# Patient Record
Sex: Female | Born: 2000 | Race: White | Hispanic: No | Marital: Married | Smoking: Never smoker
Health system: Southern US, Community
[De-identification: ages and names within clinical notes are randomized; demographics above are authoritative.]

## PROBLEM LIST (undated history)

## (undated) DIAGNOSIS — K589 Irritable bowel syndrome without diarrhea: Secondary | ICD-10-CM

## (undated) DIAGNOSIS — M069 Rheumatoid arthritis, unspecified: Secondary | ICD-10-CM

## (undated) DIAGNOSIS — E611 Iron deficiency: Secondary | ICD-10-CM

## (undated) DIAGNOSIS — I7300 Raynaud's syndrome without gangrene: Secondary | ICD-10-CM

## (undated) DIAGNOSIS — K449 Diaphragmatic hernia without obstruction or gangrene: Secondary | ICD-10-CM

## (undated) HISTORY — DX: Raynaud's syndrome without gangrene: I73.00

## (undated) HISTORY — DX: Irritable bowel syndrome, unspecified: K58.9

## (undated) HISTORY — PX: TYMPANOSTOMY TUBE PLACEMENT: SHX32

## (undated) HISTORY — DX: Diaphragmatic hernia without obstruction or gangrene: K44.9

## (undated) HISTORY — DX: Iron deficiency: E61.1

---

## 2019-01-02 ENCOUNTER — Emergency Department (HOSPITAL_COMMUNITY)
Admission: EM | Admit: 2019-01-02 | Discharge: 2019-01-02 | Disposition: A | Discharge status: Home / Self Care | Payer: Medicaid Other | Attending: Emergency Medicine | Admitting: Emergency Medicine

## 2019-01-02 ENCOUNTER — Other Ambulatory Visit: Payer: Self-pay

## 2019-01-02 ENCOUNTER — Encounter (HOSPITAL_COMMUNITY): Payer: Self-pay

## 2019-01-02 ENCOUNTER — Emergency Department (HOSPITAL_COMMUNITY): Payer: Medicaid Other

## 2019-01-02 DIAGNOSIS — R0789 Other chest pain: Secondary | ICD-10-CM | POA: Diagnosis not present

## 2019-01-02 DIAGNOSIS — R030 Elevated blood-pressure reading, without diagnosis of hypertension: Secondary | ICD-10-CM | POA: Insufficient documentation

## 2019-01-02 HISTORY — DX: Rheumatoid arthritis, unspecified: M06.9

## 2019-01-02 LAB — URINALYSIS, ROUTINE W REFLEX MICROSCOPIC
Bilirubin Urine: NEGATIVE
Glucose, UA: NEGATIVE mg/dL
Hgb urine dipstick: NEGATIVE
Ketones, ur: NEGATIVE mg/dL
Nitrite: NEGATIVE
Protein, ur: NEGATIVE mg/dL
Specific Gravity, Urine: 1.008 (ref 1.005–1.030)
pH: 8 (ref 5.0–8.0)

## 2019-01-02 LAB — CBC WITH DIFFERENTIAL/PLATELET
Abs Immature Granulocytes: 0.02 10*3/uL (ref 0.00–0.07)
Basophils Absolute: 0 10*3/uL (ref 0.0–0.1)
Basophils Relative: 1 %
Eosinophils Absolute: 0.1 10*3/uL (ref 0.0–0.5)
Eosinophils Relative: 1 %
HCT: 41.7 % (ref 36.0–46.0)
Hemoglobin: 13.7 g/dL (ref 12.0–15.0)
Immature Granulocytes: 0 %
Lymphocytes Relative: 39 %
Lymphs Abs: 2.9 10*3/uL (ref 0.7–4.0)
MCH: 30.4 pg (ref 26.0–34.0)
MCHC: 32.9 g/dL (ref 30.0–36.0)
MCV: 92.7 fL (ref 80.0–100.0)
Monocytes Absolute: 0.8 10*3/uL (ref 0.1–1.0)
Monocytes Relative: 11 %
Neutro Abs: 3.5 10*3/uL (ref 1.7–7.7)
Neutrophils Relative %: 48 %
Platelets: 309 10*3/uL (ref 150–400)
RBC: 4.5 MIL/uL (ref 3.87–5.11)
RDW: 12.1 % (ref 11.5–15.5)
WBC: 7.3 10*3/uL (ref 4.0–10.5)
nRBC: 0 % (ref 0.0–0.2)

## 2019-01-02 LAB — BASIC METABOLIC PANEL
Anion gap: 10 (ref 5–15)
BUN: 7 mg/dL (ref 6–20)
CO2: 24 mmol/L (ref 22–32)
Calcium: 9.4 mg/dL (ref 8.9–10.3)
Chloride: 105 mmol/L (ref 98–111)
Creatinine, Ser: 0.67 mg/dL (ref 0.44–1.00)
GFR calc Af Amer: >60 mL/min (ref >60)
GFR calc non Af Amer: >60 mL/min (ref >60)
Glucose, Bld: 98 mg/dL (ref 70–99)
Potassium: 3.5 mmol/L (ref 3.5–5.1)
Sodium: 139 mmol/L (ref 135–145)

## 2019-01-02 LAB — TROPONIN I (HIGH SENSITIVITY): Troponin I (High Sensitivity): <2 ng/L (ref <18)

## 2019-01-02 LAB — I-STAT BETA HCG BLOOD, ED (MC, WL, AP ONLY): I-stat hCG, quantitative: <5 m[IU]/mL (ref <5)

## 2019-01-02 NOTE — ED Triage Notes (Signed)
Pt had BP checked at [facility] BP 132/100 because she was feeling jittery, sweaty, and fast HR.  Pt called her PCP and told  Her to come to ED.

## 2019-01-02 NOTE — ED Provider Notes (Signed)
[facility] EMERGENCY DEPARTMENT Provider Note   CSN: [id number] Arrival date & time: 01/02/19  1642     History   Chief Complaint Chief Complaint  Patient presents with  . Hypertension    HPI Tasha West is a 18 y.o. female with past medical history significant for rheumatoid arthritis presents emergency department with chief complaint of hypertension x1 day.  Patient states she drank coffee last night.  Afterwards she felt jittery, she attributed to the cup of coffee she had drank and went to bed.  She states when she woke up this morning she continued to have the jittery feeling and chest pain in the center of her chest.  She states the pain was a dull ache.  Pain does not radiate she rates the pain 3 out of 10 in severity.  Chest pain is not worse with exertion.  She went to [facility] and checked her blood pressure and it was found to be 132/100.  She reports that she was checking through feeling jittery, sweaty and like her heart was beating fast.  She called her primary care doctor who recommended ED evaluation if symptoms did not improve.  Patient states throughout the day she continued to feel the same way which prompted her to come to the emergency department.  She denies fever, chills, shortness of breath, abdominal pain, nausea, vomiting, lower extremity edema. Pt does not take birth control.  Cardiac risk factors include family history of mother with hypertension.  Patient does not smoke, is not diabetic, no history of diagnosed hypertension or CAD, no hyperlipidemia or PAD.   Past Medical History:  Diagnosis Date  . Rheumatoid arthritis (HCC)     There are no active problems to display for this patient.   History reviewed. No pertinent surgical history.   OB History   No obstetric history on file.      Home Medications    Prior to Admission medications   Not on File    Family History History reviewed. No pertinent family history.   Social History Social History   Tobacco Use  . Smoking status: Never Smoker  Substance Use Topics  . Alcohol use: Never    Frequency: Never  . Drug use: Never     Allergies   Patient has no known allergies.   Review of Systems Review of Systems  Constitutional: Negative for chills and fever.  HENT: Negative for congestion, ear discharge, ear pain, sinus pressure, sinus pain and sore throat.   Eyes: Negative for pain and redness.  Respiratory: Negative for cough, shortness of breath and wheezing.   Cardiovascular: Positive for chest pain. Negative for palpitations and leg swelling.  Gastrointestinal: Negative for abdominal pain, constipation, diarrhea, nausea and vomiting.  Genitourinary: Negative for dysuria and hematuria.  Musculoskeletal: Negative for back pain and neck pain.  Skin: Negative for wound.  Neurological: Negative for weakness, numbness and headaches.     Physical Exam Updated Vital Signs BP 131/83 (BP Location: Right Arm)   Pulse 83   Temp 97.9 F (36.6 C) (Oral)   Resp 16   LMP 12/23/2018   SpO2 100%   Physical Exam Vitals signs and nursing note reviewed.  Constitutional:      Appearance: She is well-developed. She is not ill-appearing or toxic-appearing.  HENT:     Head: Normocephalic and atraumatic.     Nose: Nose normal.  Eyes:     General: No scleral icterus.       Right  eye: No discharge.        Left eye: No discharge.     Conjunctiva/sclera: Conjunctivae normal.  Neck:     Musculoskeletal: Normal range of motion.     Vascular: No JVD.  Cardiovascular:     Rate and Rhythm: Normal rate and regular rhythm.     Pulses: Normal pulses.          Radial pulses are 2+ on the right side and 2+ on the left side.     Heart sounds: Normal heart sounds. No murmur.  Pulmonary:     Effort: Pulmonary effort is normal.     Breath sounds: Normal breath sounds.  Abdominal:     General: There is no distension.  Musculoskeletal: Normal range of  motion.     Right lower leg: No edema.     Left lower leg: No edema.  Skin:    General: Skin is warm and dry.  Neurological:     Mental Status: She is oriented to person, place, and time.     GCS: GCS eye subscore is 4. GCS verbal subscore is 5. GCS motor subscore is 6.     Comments: Fluent speech, no facial droop.  Psychiatric:        Behavior: Behavior normal.      ED Treatments / Results  Labs (all labs ordered are listed, but only abnormal results are displayed) Labs Reviewed  URINALYSIS, ROUTINE W REFLEX MICROSCOPIC - Abnormal; Notable for the following components:      Result Value   Color, Urine STRAW (*)    Leukocytes,Ua MODERATE (*)    Bacteria, UA RARE (*)    All other components within normal limits  CBC WITH DIFFERENTIAL/PLATELET  BASIC METABOLIC PANEL  I-STAT BETA HCG BLOOD, ED ([facility], [facility], [facility] ONLY)  TROPONIN I (HIGH SENSITIVITY)    EKG EKG Interpretation  Date/Time:  Wednesday January 02 2019 19:06:35 EDT Ventricular Rate:  95 PR Interval:    QRS Duration: 93 QT Interval:  363 QTC Calculation: 457 R Axis:   85 Text Interpretation: Sinus rhythm Right atrial enlargement RSR' in V1 or V2, probably normal variant No old tracing to compare Confirmed by Jacalyn LefevreHaviland, Julie [phone]([id number]) on 01/02/2019 7:12:10 PM   Radiology Dg Chest 2 View  Result Date: 01/02/2019 CLINICAL DATA:  Chest pain for 1 day EXAM: CHEST - 2 VIEW COMPARISON:  None. FINDINGS: The heart size and mediastinal contours are within normal limits. Both lungs are clear. The visualized skeletal structures are unremarkable. IMPRESSION: No active cardiopulmonary disease. Electronically Signed   By: Alcide CleverMark  Lukens M.D.   On: 01/02/2019 21:02    Procedures Procedures (including critical care time)  Medications Ordered in ED Medications - No data to display   Initial Impression / Assessment and Plan / ED Course  I have reviewed the triage vital signs and the nursing notes.  Pertinent labs & imaging  results that were available during my care of the patient were reviewed by me and considered in my medical decision making (see chart for details).  Patient presents to the emergency department with chest pain. Patient nontoxic appearing, in no apparent distress, vitals without significant abnormality.  She was noted to be tachycardic to 105 in triage.  On my initial exam she had a heart rate in the 80s.  Fairly benign physical exam. DDX: ACS, pulmonary embolism, dissection, pneumothorax, effusion, infiltrate, arrhythmia, anemia, electrolyte derangement, MSK. Evaluation initiated with labs, EKG, and CXR. Patient on cardiac monitor.   Work-up  in the ER unremarkable. Labs reviewed, no leukocytosis, anemia, or significant electrolyte abnormality. CXR without infiltrate, effusion, pneumothorax, or fracture/dislocation.  UA with moderate leukocytes, no WBC and rare bacteria, nitrite negative.  Discussed results with patient.  She denies dysuria, urinary frequency, gross hematuria or any signs of urinary tract infection will hold off on antibiotics at this time.  Low risk heart score of 1, EKG without obvious ischemia, troponin, doubt ACS. Patient is low risk wells, doubt pulmonary embolism. Pain is not a tearing sensation, symmetric pulses, no widening of mediastinum on CXR, doubt dissection. Cardiac monitor reviewed, no notable arrhythmias or tachycardia. Patient has appeared hemodynamically stable throughout ER visit and appears safe for discharge with close pcp follow up. I discussed results, treatment plan, need for PCP follow-up, and return precautions with the patient. Provided opportunity for questions, patient confirmed understanding and is in agreement with plan.   Portions of this note were generated with [employer]. Dictation errors may occur despite best attempts at proofreading.   Final Clinical Impressions(s) / ED Diagnoses   Final diagnoses:  Elevated blood pressure reading   Atypical chest pain    ED Discharge Orders    None       Flint Melter 01/02/19 2236    Isla Pence, MD 01/02/19 2251

## 2019-01-02 NOTE — ED Notes (Signed)
Patient verbalizes understanding of discharge instructions. Opportunity for questioning and answers were provided. Armband removed by staff, pt discharged from ED ambulatory.   

## 2019-01-02 NOTE — Discharge Instructions (Signed)
Read instructions below for reasons to return to the Emergency Department. It is recommended that your follow up with your Primary Care Doctor in regards to today's visit.   Tests performed today include: An EKG of your heart A chest x-ray Cardiac enzymes - a blood test for heart muscle damage Blood counts and electrolytes  Chest Pain (Nonspecific)  HOME CARE INSTRUCTIONS  -For the next few days, avoid physical activities that bring on chest pain. Continue physical activities as directed.   SEEK MEDICAL CARE IF:  You think you are having problems from the medicine you are taking. Read your medicine instructions carefully.  Your chest pain does not go away, even after treatment.  You develop a rash with blisters on your chest.   SEEK IMMEDIATE MEDICAL CARE IF:  You have increased chest pain or pain that spreads to your arm, neck, jaw, back, or belly (abdomen).  You develop shortness of breath, an increasing cough, or you are coughing up blood.  You have severe back or abdominal pain, feel sick to your stomach (nauseous) or throw up (vomit).  You develop severe weakness, fainting, or chills.  You have an oral temperature above 102 F (38.9 C), not controlled by medicine.   THIS IS AN EMERGENCY. Do not wait to see if the pain will go away. Get medical help at once. Call 911. Do not drive yourself to the hospital.

## 2019-07-24 ENCOUNTER — Other Ambulatory Visit: Payer: Self-pay | Admitting: Physician Assistant

## 2019-07-24 DIAGNOSIS — R1030 Lower abdominal pain, unspecified: Secondary | ICD-10-CM

## 2019-07-24 DIAGNOSIS — R634 Abnormal weight loss: Secondary | ICD-10-CM

## 2019-08-06 ENCOUNTER — Other Ambulatory Visit: Payer: Medicaid Other

## 2019-08-07 ENCOUNTER — Ambulatory Visit
Admission: RE | Admit: 2019-08-07 | Discharge: 2019-08-07 | Disposition: A | Discharge status: Home / Self Care | Payer: Medicaid Other | Source: Ambulatory Visit | Attending: Physician Assistant | Admitting: Physician Assistant

## 2019-08-07 ENCOUNTER — Other Ambulatory Visit: Payer: Self-pay

## 2019-08-07 DIAGNOSIS — R1030 Lower abdominal pain, unspecified: Secondary | ICD-10-CM

## 2019-08-07 DIAGNOSIS — R634 Abnormal weight loss: Secondary | ICD-10-CM

## 2019-08-07 MED ORDER — IOPAMIDOL (ISOVUE-300) INJECTION 61%
100.0000 mL | Freq: Once | INTRAVENOUS | Status: AC | PRN
  Administered 2019-08-07: 100 mL via INTRAVENOUS

## 2020-08-10 ENCOUNTER — Other Ambulatory Visit: Payer: Self-pay

## 2020-08-10 ENCOUNTER — Emergency Department (HOSPITAL_COMMUNITY): Payer: BC Managed Care – PPO

## 2020-08-10 ENCOUNTER — Encounter (HOSPITAL_COMMUNITY): Payer: Self-pay

## 2020-08-10 ENCOUNTER — Emergency Department (HOSPITAL_COMMUNITY)
Admission: EM | Admit: 2020-08-10 | Discharge: 2020-08-10 | Disposition: A | Discharge status: Home / Self Care | Payer: BC Managed Care – PPO | Attending: Emergency Medicine | Admitting: Emergency Medicine

## 2020-08-10 DIAGNOSIS — R55 Syncope and collapse: Secondary | ICD-10-CM | POA: Insufficient documentation

## 2020-08-10 DIAGNOSIS — N939 Abnormal uterine and vaginal bleeding, unspecified: Secondary | ICD-10-CM | POA: Insufficient documentation

## 2020-08-10 DIAGNOSIS — R5383 Other fatigue: Secondary | ICD-10-CM | POA: Diagnosis not present

## 2020-08-10 DIAGNOSIS — R000 Tachycardia, unspecified: Secondary | ICD-10-CM | POA: Insufficient documentation

## 2020-08-10 DIAGNOSIS — R0602 Shortness of breath: Secondary | ICD-10-CM | POA: Diagnosis not present

## 2020-08-10 LAB — URINALYSIS, ROUTINE W REFLEX MICROSCOPIC
Bilirubin Urine: NEGATIVE
Glucose, UA: NEGATIVE mg/dL
Ketones, ur: NEGATIVE mg/dL
Leukocytes,Ua: NEGATIVE
Nitrite: NEGATIVE
Protein, ur: NEGATIVE mg/dL
Specific Gravity, Urine: 1.003 — ABNORMAL LOW (ref 1.005–1.030)
pH: 7 (ref 5.0–8.0)

## 2020-08-10 LAB — CBC WITH DIFFERENTIAL/PLATELET
Abs Immature Granulocytes: 0.02 10*3/uL (ref 0.00–0.07)
Basophils Absolute: 0 10*3/uL (ref 0.0–0.1)
Basophils Relative: 1 %
Eosinophils Absolute: 0.1 10*3/uL (ref 0.0–0.5)
Eosinophils Relative: 1 %
HCT: 38 % (ref 36.0–46.0)
Hemoglobin: 12 g/dL (ref 12.0–15.0)
Immature Granulocytes: 0 %
Lymphocytes Relative: 35 %
Lymphs Abs: 2.2 10*3/uL (ref 0.7–4.0)
MCH: 28.1 pg (ref 26.0–34.0)
MCHC: 31.6 g/dL (ref 30.0–36.0)
MCV: 89 fL (ref 80.0–100.0)
Monocytes Absolute: 0.6 10*3/uL (ref 0.1–1.0)
Monocytes Relative: 9 %
Neutro Abs: 3.4 10*3/uL (ref 1.7–7.7)
Neutrophils Relative %: 54 %
Platelets: 316 10*3/uL (ref 150–400)
RBC: 4.27 MIL/uL (ref 3.87–5.11)
RDW: 13 % (ref 11.5–15.5)
WBC: 6.2 10*3/uL (ref 4.0–10.5)
nRBC: 0 % (ref 0.0–0.2)

## 2020-08-10 LAB — D-DIMER, QUANTITATIVE: D-Dimer, Quant: <0.27 ug/mL-FEU (ref 0.00–0.50)

## 2020-08-10 LAB — COMPREHENSIVE METABOLIC PANEL
ALT: 11 U/L (ref 0–44)
AST: 17 U/L (ref 15–41)
Albumin: 3.9 g/dL (ref 3.5–5.0)
Alkaline Phosphatase: 35 U/L — ABNORMAL LOW (ref 38–126)
Anion gap: 9 (ref 5–15)
BUN: 7 mg/dL (ref 6–20)
CO2: 23 mmol/L (ref 22–32)
Calcium: 9.1 mg/dL (ref 8.9–10.3)
Chloride: 105 mmol/L (ref 98–111)
Creatinine, Ser: 0.73 mg/dL (ref 0.44–1.00)
GFR, Estimated: >60 mL/min (ref >60)
Glucose, Bld: 94 mg/dL (ref 70–99)
Potassium: 3.6 mmol/L (ref 3.5–5.1)
Sodium: 137 mmol/L (ref 135–145)
Total Bilirubin: 0.5 mg/dL (ref 0.3–1.2)
Total Protein: 6.7 g/dL (ref 6.5–8.1)

## 2020-08-10 LAB — I-STAT BETA HCG BLOOD, ED (MC, WL, AP ONLY): I-stat hCG, quantitative: <5 m[IU]/mL (ref <5)

## 2020-08-10 LAB — TYPE AND SCREEN
ABO/RH(D): A POS
Antibody Screen: NEGATIVE

## 2020-08-10 MED ORDER — LACTATED RINGERS IV BOLUS
1000.0000 mL | Freq: Once | INTRAVENOUS | Status: AC
  Administered 2020-08-10: 1000 mL via INTRAVENOUS

## 2020-08-10 NOTE — Discharge Instructions (Addendum)
Call your primary care doctor or specialist as discussed in the next 2-3 days.   Return immediately back to the ER if:  Your symptoms worsen within the next 12-24 hours. You develop new symptoms such as new fevers, persistent vomiting, new pain, shortness of breath, or new weakness or numbness, or if you have any other concerns.  

## 2020-08-10 NOTE — ED Triage Notes (Signed)
Pt reports she is here today due to vaginal bleeding. Pt states she has h/o anemia and states she was told by her PCP to come to make sure she didn't need a blood transfusion.

## 2020-08-10 NOTE — ED Provider Notes (Signed)
Emergency Medicine Provider Triage Evaluation Note  Tasha West , a 20 y.o. female  was evaluated in triage.  Pt complains of presyncope x 1 week. Has had heavy menstrual cycle. Reports feeling presyncopal for hours the past week. Needing to rest, tired after walking. Some shortness of breath. No bloody stool, no hematemesis. Some nausea. Switched birthcontrol one Week ago. History of anemia.   Review of Systems  Positive: Above Negative: Above   Physical Exam  BP (!) 135/91 (BP Location: Right Arm)   Pulse (!) 108   Temp 98.6 F (37 C) (Oral)   Resp 14   LMP 08/10/2020   SpO2 100%  Gen:   Awake, no distress   Resp:  Normal effort  MSK:   Moves extremities without difficulty  Other:  S1, S2. Pale conjunctiva   Medical Decision Making  Medically screening exam initiated at 11:41 AM.  Appropriate orders placed.  Tasha West was informed that the remainder of the evaluation will be completed by another provider, this initial triage assessment does not replace that evaluation, and the importance of remaining in the ED until their evaluation is complete.     Theron Arista, PA-C 08/10/20 1144    Arby Barrette, MD 08/18/20 2125

## 2020-08-10 NOTE — ED Notes (Signed)
O2 dropped to 85% while ambulating. Pt stats she felt Sob, shaky, and lightheaded.

## 2020-08-10 NOTE — ED Provider Notes (Signed)
[facility] EMERGENCY DEPARTMENT Provider Note   CSN: [id number] Arrival date & time: 08/10/20  1131     History Chief Complaint  Patient presents with  . Vaginal Bleeding    Tasha West is a 20 y.o. female.  Patient is a 20 year old female with a history of rheumatoid arthritis presenting today with complaint of heavy vaginal bleeding, 4 days of lightheadedness with walking or standing, symptoms of near syncope, shortness of breath with exertion but no chest pain.  Patient did recently change birth control from the NuvaRing to a patch which does have estrogen in it.  She has a prior history of anemia and was concerned that that was the cause of her symptoms.  She denies any cough, congestion, fever.  No abdominal pain nausea or vomiting.  No dysuria frequency or urgency.  She denies any other vaginal discharge other than bleeding.  Last normal menses was last week and then developed heavy bleeding on Thursday.  It has been waxing and waning.  When she spoke with her doctor today they recommended she come here to rule out anemia.  No prior history of blood clots or family history.  No tobacco use and only occasional alcohol use.  The history is provided by the patient.  Vaginal Bleeding      Past Medical History:  Diagnosis Date  . Rheumatoid arthritis (HCC)     There are no problems to display for this patient.   History reviewed. No pertinent surgical history.   OB History   No obstetric history on file.     History reviewed. No pertinent family history.  Social History   Tobacco Use  . Smoking status: Never Smoker  Substance Use Topics  . Alcohol use: Never  . Drug use: Never    Home Medications Prior to Admission medications   Not on File    Allergies    Patient has no known allergies.  Review of Systems   Review of Systems  Genitourinary: Positive for vaginal bleeding.  All other systems reviewed and are  negative.   Physical Exam Updated Vital Signs BP 121/79 (BP Location: Left Arm)   Pulse 83   Temp 98.5 F (36.9 C) (Oral)   Resp 16   LMP 08/10/2020   SpO2 100%   Physical Exam Vitals and nursing note reviewed.  Constitutional:      General: She is not in acute distress.    Appearance: Normal appearance. She is well-developed.  HENT:     Head: Normocephalic and atraumatic.     Mouth/Throat:     Mouth: Mucous membranes are moist.  Eyes:     Pupils: Pupils are equal, round, and reactive to light.  Cardiovascular:     Rate and Rhythm: Regular rhythm. Tachycardia present.     Pulses: Normal pulses.     Heart sounds: Normal heart sounds. No murmur heard. No friction rub.  Pulmonary:     Effort: Pulmonary effort is normal.     Breath sounds: Normal breath sounds. No wheezing or rales.  Abdominal:     General: Bowel sounds are normal. There is no distension.     Palpations: Abdomen is soft.     Tenderness: There is no abdominal tenderness. There is no guarding or rebound.  Musculoskeletal:        General: No tenderness. Normal range of motion.     Cervical back: Normal range of motion and neck supple.     Comments: No edema  Skin:    General: Skin is warm and dry.     Findings: No rash.  Neurological:     Mental Status: She is alert and oriented to person, place, and time. Mental status is at baseline.     Cranial Nerves: No cranial nerve deficit.  Psychiatric:        Mood and Affect: Mood normal.        Behavior: Behavior normal.     ED Results / Procedures / Treatments   Labs (all labs ordered are listed, but only abnormal results are displayed) Labs Reviewed  COMPREHENSIVE METABOLIC PANEL - Abnormal; Notable for the following components:      Result Value   Alkaline Phosphatase 35 (*)    All other components within normal limits  URINALYSIS, ROUTINE W REFLEX MICROSCOPIC - Abnormal; Notable for the following components:   Specific Gravity, Urine 1.003 (*)     Hgb urine dipstick LARGE (*)    Bacteria, UA RARE (*)    All other components within normal limits  CBC WITH DIFFERENTIAL/PLATELET  I-STAT BETA HCG BLOOD, ED ([facility], [facility], [facility] ONLY)  TYPE AND SCREEN    EKG None  ED ECG REPORT   Date: 08/10/2020  Rate: 92  Rhythm: sinus arrhythmia  QRS Axis: normal  Intervals: normal  ST/T Wave abnormalities: normal  Conduction Disutrbances:none  Narrative Interpretation:   Old EKG Reviewed: none available  I have personally reviewed the EKG tracing and agree with the computerized printout as noted.   Radiology No results found.  Procedures Procedures   Medications Ordered in ED Medications - No data to display  ED Course  I have reviewed the triage vital signs and the nursing notes.  Pertinent labs & imaging results that were available during my care of the patient were reviewed by me and considered in my medical decision making (see chart for details).    MDM Rules/Calculators/A&P                          Patient presenting today with 4 days of feeling fatigue, shortness of breath with exertion, dizzy and near syncopal sometimes with standing and walking and heavier vaginal bleeding for the last 5 days.  Patient has had prior history of anemia before related to heavy menses but had been using the NuvaRing and periods had reduced.  She however went off of the NuvaRing and then started the patch several days ago but vaginal bleeding started before that time.  She denies any chest pain or infectious etiology at this time.  She does have increased risk for blood clot because of the birth control but no other risk factors.  Upon arrival patient was mildly tachycardic.  Hemoglobin today is normal at 12 and not the cause of her symptoms.  hCG is negative.  CMP is normal with normal creatinine sodium and potassium.  UA with blood but no other symptoms.  Patient is not displaying symptoms concerning for acute pelvic pathology.  She has no abdominal pain,  vaginal discharge.  Urine with some blood but is a contaminant and no other evidence of infection.  Will get chest x-ray, EKG and D-dimer for further evaluation. Final Clinical Impression(s) / ED Diagnoses Final diagnoses:  None    Rx / DC Orders ED Discharge Orders    None       Gwyneth Sprout, MD 08/11/20 1147

## 2020-08-10 NOTE — ED Provider Notes (Signed)
Is undetectable.  Given work-up today I doubt pulmonary embolism.  Patient continues at 90% on room air which is appropriate.  Will advise outpatient follow-up with her doctor within the week, advised immediate return for worsening symptoms fevers cough pain trouble breathing or any additional concerns.   Cheryll Cockayne, MD 08/10/20 [phone]

## 2020-08-30 ENCOUNTER — Emergency Department (HOSPITAL_COMMUNITY): Payer: BC Managed Care – PPO

## 2020-08-30 ENCOUNTER — Encounter (HOSPITAL_COMMUNITY): Payer: Self-pay | Admitting: Emergency Medicine

## 2020-08-30 ENCOUNTER — Other Ambulatory Visit: Payer: Self-pay

## 2020-08-30 ENCOUNTER — Emergency Department (HOSPITAL_COMMUNITY)
Admission: EM | Admit: 2020-08-30 | Discharge: 2020-08-31 | Disposition: A | Discharge status: Home / Self Care | Payer: BC Managed Care – PPO | Attending: Emergency Medicine | Admitting: Emergency Medicine

## 2020-08-30 DIAGNOSIS — R197 Diarrhea, unspecified: Secondary | ICD-10-CM | POA: Diagnosis not present

## 2020-08-30 DIAGNOSIS — R109 Unspecified abdominal pain: Secondary | ICD-10-CM

## 2020-08-30 DIAGNOSIS — R110 Nausea: Secondary | ICD-10-CM | POA: Diagnosis not present

## 2020-08-30 DIAGNOSIS — R1013 Epigastric pain: Secondary | ICD-10-CM | POA: Diagnosis not present

## 2020-08-30 DIAGNOSIS — R1011 Right upper quadrant pain: Secondary | ICD-10-CM | POA: Diagnosis not present

## 2020-08-30 LAB — URINALYSIS, ROUTINE W REFLEX MICROSCOPIC
Bilirubin Urine: NEGATIVE
Glucose, UA: NEGATIVE mg/dL
Ketones, ur: 20 mg/dL — AB
Leukocytes,Ua: NEGATIVE
Nitrite: NEGATIVE
Protein, ur: NEGATIVE mg/dL
Specific Gravity, Urine: 1.005 (ref 1.005–1.030)
pH: 5 (ref 5.0–8.0)

## 2020-08-30 LAB — COMPREHENSIVE METABOLIC PANEL
ALT: 23 U/L (ref 0–44)
AST: 30 U/L (ref 15–41)
Albumin: 3.6 g/dL (ref 3.5–5.0)
Alkaline Phosphatase: 33 U/L — ABNORMAL LOW (ref 38–126)
Anion gap: 9 (ref 5–15)
BUN: <5 mg/dL — ABNORMAL LOW (ref 6–20)
CO2: 22 mmol/L (ref 22–32)
Calcium: 8.7 mg/dL — ABNORMAL LOW (ref 8.9–10.3)
Chloride: 107 mmol/L (ref 98–111)
Creatinine, Ser: 0.64 mg/dL (ref 0.44–1.00)
GFR, Estimated: >60 mL/min (ref >60)
Glucose, Bld: 99 mg/dL (ref 70–99)
Potassium: 3.6 mmol/L (ref 3.5–5.1)
Sodium: 138 mmol/L (ref 135–145)
Total Bilirubin: 0.5 mg/dL (ref 0.3–1.2)
Total Protein: 6.4 g/dL — ABNORMAL LOW (ref 6.5–8.1)

## 2020-08-30 LAB — CBC
HCT: 39.5 % (ref 36.0–46.0)
Hemoglobin: 12.5 g/dL (ref 12.0–15.0)
MCH: 27.8 pg (ref 26.0–34.0)
MCHC: 31.6 g/dL (ref 30.0–36.0)
MCV: 87.8 fL (ref 80.0–100.0)
Platelets: 231 10*3/uL (ref 150–400)
RBC: 4.5 MIL/uL (ref 3.87–5.11)
RDW: 13.2 % (ref 11.5–15.5)
WBC: 6 10*3/uL (ref 4.0–10.5)
nRBC: 0 % (ref 0.0–0.2)

## 2020-08-30 LAB — I-STAT BETA HCG BLOOD, ED (MC, WL, AP ONLY): I-stat hCG, quantitative: <5 m[IU]/mL (ref <5)

## 2020-08-30 LAB — LIPASE, BLOOD: Lipase: 33 U/L (ref 11–51)

## 2020-08-30 MED ORDER — MORPHINE SULFATE (PF) 4 MG/ML IV SOLN
4.0000 mg | Freq: Once | INTRAVENOUS | Status: AC
  Administered 2020-08-30: 4 mg via INTRAVENOUS
  Filled 2020-08-30: qty 1

## 2020-08-30 MED ORDER — SODIUM CHLORIDE 0.9 % IV BOLUS
1000.0000 mL | Freq: Once | INTRAVENOUS | Status: AC
  Administered 2020-08-30: 1000 mL via INTRAVENOUS

## 2020-08-30 MED ORDER — ONDANSETRON HCL 4 MG/2ML IJ SOLN
4.0000 mg | Freq: Once | INTRAMUSCULAR | Status: AC
  Administered 2020-08-30: 4 mg via INTRAVENOUS
  Filled 2020-08-30: qty 2

## 2020-08-30 NOTE — ED Triage Notes (Signed)
Patient reports mid/RUQ abdominal pain with nausea and diarrhea onset this week .

## 2020-08-30 NOTE — ED Provider Notes (Signed)
[facility] EMERGENCY DEPARTMENT Provider Note   CSN: [id number] Arrival date & time: 08/30/20  2028     History Chief Complaint  Patient presents with   Abdominal Pain    Tasha West is a 20 y.o. female presents to the Emergency Department complaining of gradual, persistent, progressively worsening epigastric and RUQ abd pain onset this morning after eating breakfast.  Pt reports that throughout the day her symptoms have intensified and become more generalized.  Pt reports 3 days of some intermittent abd cramping, worse with food intake, but no severe pain. Pt denies fevers, chills, sick contacts, recent travel.  Pt reports watery diarrhea for the last 2 days without melena or hematochezia.  Pt denies hx of abdominal surgeries.  Eating makes the pain worse.  Lying still seems to make it better.  She denies all urinary and vaginal symptoms.   The history is provided by the patient and medical records. No language interpreter was used.      Past Medical History:  Diagnosis Date   Rheumatoid arthritis (HCC)     There are no problems to display for this patient.   No past surgical history on file.   OB History   No obstetric history on file.     No family history on file.  Social History   Tobacco Use   Smoking status: Never  Substance Use Topics   Alcohol use: Never   Drug use: Never    Home Medications Prior to Admission medications   Medication Sig Start Date End Date Taking? Authorizing Provider  bismuth subsalicylate (PEPTO BISMOL) 262 MG/15ML suspension Take 45 mLs by mouth every 6 (six) hours as needed for indigestion or diarrhea or loose stools.   Yes [provider]  cholecalciferol (VITAMIN D) 25 MCG (1000 UNIT) tablet Take 1,000 Units by mouth daily.   Yes [provider]  etanercept (ENBREL) 50 MG/ML injection Inject 50 mg into the skin every Tuesday. 07/24/19  Yes [provider]  ibuprofen (ADVIL) 200  MG tablet Take 200 mg by mouth every 6 (six) hours as needed for headache.   Yes [provider]  ondansetron (ZOFRAN) 4 MG tablet Take 1 tablet (4 mg total) by mouth every 8 (eight) hours as needed for nausea or vomiting. 08/31/20  Yes Jacklin Zwick, Dahlia Client, PA-C  pantoprazole (PROTONIX) 20 MG tablet Take 20 mg by mouth 2 (two) times daily. 05/31/20  Yes [provider]  PROAIR HFA 108 (90 Base) MCG/ACT inhaler Inhale 1 puff into the lungs every 6 (six) hours as needed for shortness of breath. 08/25/20  Yes [provider]  Probiotic Product (ALIGN) 4 MG CAPS Take 8 mg by mouth in the morning and at bedtime. 10/11/19  Yes [provider]  sucralfate (CARAFATE) 1 g tablet Take 1 tablet (1 g total) by mouth 4 (four) times daily -  with meals and at bedtime. 08/31/20  Yes Sharanda Shinault, Dahlia Client, PA-C  Turmeric (QC TUMERIC COMPLEX PO) Take 1 capsule by mouth daily.   Yes [provider]  vitamin B-12 (CYANOCOBALAMIN) 1000 MCG tablet Take 1,000 mcg by mouth daily.   Yes [provider]  Burr Medico 150-35 MCG/24HR transdermal patch Place 1 patch onto the skin once a week. 08/04/20  Yes [provider]    Allergies    Patient has no known allergies.  Review of Systems   Review of Systems  Constitutional:  Negative for appetite change, diaphoresis, fatigue, fever and unexpected weight change.  HENT:  Negative for mouth sores.   Eyes:  Negative for visual disturbance.  Respiratory:  Negative for cough, chest tightness, shortness of breath and wheezing.   Cardiovascular:  Negative for chest pain.  Gastrointestinal:  Positive for abdominal pain, diarrhea and nausea. Negative for constipation and vomiting.  Endocrine: Negative for polydipsia, polyphagia and polyuria.  Genitourinary:  Negative for dysuria, frequency, hematuria and urgency.  Musculoskeletal:  Negative for back pain and neck stiffness.  Skin:  Negative for rash.  Allergic/Immunologic:  Negative for immunocompromised state.  Neurological:  Negative for syncope, light-headedness and headaches.  Hematological:  Does not bruise/bleed easily.  Psychiatric/Behavioral:  Negative for sleep disturbance. The patient is not nervous/anxious.    Physical Exam Updated Vital Signs BP 112/74   Pulse 90   Temp 98.4 F (36.9 C) (Oral)   Resp 18   Ht 5\' 7"  (1.702 m)   Wt 59 kg   LMP 08/29/2020   SpO2 100%   BMI 20.37 kg/m   Physical Exam Vitals and nursing note reviewed.  Constitutional:      General: She is not in acute distress.    Appearance: She is not diaphoretic.  HENT:     Head: Normocephalic.  Eyes:     General: No scleral icterus.    Conjunctiva/sclera: Conjunctivae normal.  Cardiovascular:     Rate and Rhythm: Normal rate and regular rhythm.     Pulses: Normal pulses.          Radial pulses are 2+ on the right side and 2+ on the left side.  Pulmonary:     Effort: No tachypnea, accessory muscle usage, prolonged expiration, respiratory distress or retractions.     Breath sounds: No stridor.     Comments: Equal chest rise. No increased work of breathing. Abdominal:     General: There is no distension.     Palpations: Abdomen is soft.     Tenderness: There is abdominal tenderness in the right upper quadrant, epigastric area and periumbilical area. There is guarding. There is no right CVA tenderness, left CVA tenderness or rebound. Positive signs include Murphy's sign.     Hernia: No hernia is present.  Musculoskeletal:     Cervical back: Normal range of motion.     Comments: Moves all extremities equally and without difficulty.  Skin:    General: Skin is warm and dry.     Capillary Refill: Capillary refill takes less than 2 seconds.  Neurological:     Mental Status: She is alert.     GCS: GCS eye subscore is 4. GCS verbal subscore is 5. GCS motor subscore is 6.     Comments: Speech is clear and goal oriented.  Psychiatric:        Mood and Affect: Mood  normal.    ED Results / Procedures / Treatments   Labs (all labs ordered are listed, but only abnormal results are displayed) Labs Reviewed  COMPREHENSIVE METABOLIC PANEL - Abnormal; Notable for the following components:      Result Value   BUN <5 (*)    Calcium 8.7 (*)    Total Protein 6.4 (*)    Alkaline Phosphatase 33 (*)    All other components within normal limits  URINALYSIS, ROUTINE W REFLEX MICROSCOPIC - Abnormal; Notable for the following components:   Hgb urine dipstick LARGE (*)    Ketones, ur 20 (*)    Bacteria, UA FEW (*)    All other components within normal limits  LIPASE,  BLOOD  CBC  I-STAT BETA HCG BLOOD, ED ([facility], [facility], [facility] ONLY)      Radiology DG Abdomen Acute W/Chest  Result Date: 08/30/2020 CLINICAL DATA:  Right upper quadrant pain EXAM: DG ABDOMEN ACUTE WITH 1 VIEW CHEST COMPARISON:  None. FINDINGS: There is no evidence of dilated bowel loops or free intraperitoneal air. No radiopaque calculi or other significant radiographic abnormality is seen. Heart size and mediastinal contours are within normal limits. Both lungs are clear. IMPRESSION: Negative abdominal radiographs.  No acute cardiopulmonary disease. Electronically Signed   By: Charlett Nose M.D.   On: 08/30/2020 23:47   US Abdomen Limited RUQ (LIVER/GB)  Result Date: 08/31/2020 CLINICAL DATA:  Right upper quadrant pain with nausea and vomiting x3 days. EXAM: ULTRASOUND ABDOMEN LIMITED RIGHT UPPER QUADRANT COMPARISON:  None. FINDINGS: Gallbladder: No gallstones or wall thickening visualized (1.9 mm). No sonographic Murphy sign noted by sonographer. Common bile duct: Diameter: 3.1 mm Liver: No focal lesion identified. Within normal limits in parenchymal echogenicity. Portal vein is patent on color Doppler imaging with normal direction of blood flow towards the liver. Other: None. IMPRESSION: Right upper quadrant ultrasound. Electronically Signed   By: Aram Candela M.D.   On: 08/31/2020 00:15     Procedures Procedures   Medications Ordered in ED Medications  sodium chloride 0.9 % bolus 1,000 mL (0 mLs Intravenous Stopped 08/31/20 0039)  morphine 4 MG/ML injection 4 mg (4 mg Intravenous Given 08/30/20 2324)  ondansetron (ZOFRAN) injection 4 mg (4 mg Intravenous Given 08/30/20 2324)    ED Course  I have reviewed the triage vital signs and the nursing notes.  Pertinent labs & imaging results that were available during my care of the patient were reviewed by me and considered in my medical decision making (see chart for details).    MDM Rules/Calculators/A&P                          Pt presents with abd pain, nausea and diarrhea.  Labs are reassuring.  Abd most tender in the epigastrium, periumbilical region and RUQ.  Given association with food intake, will obtain RUQ [place].  Lipase WNL - less likely to be pancreatitis.   1:08 AM Plain films are without abnormal bowel gas pattern.  And evidence of bowel obstruction.  Right upper quadrant ultrasound without evidence of gallstones or cholecystitis.  Patient with improved pain after pain control and nausea medications.  She has been able to eat and drink without difficulty.  No persistent diarrhea or vomiting here in the emergency department.  Abdomen remains sore but without rebound or guarding at this time.  Will discharge home with Carafate, Zofran and instructions to hydrate.  Patient is to follow with primary care.  Discussed reasons to return to the emergency department.  Patient states understanding and is in agreement with the plan.   Final Clinical Impression(s) / ED Diagnoses Final diagnoses:  Epigastric abdominal pain    Rx / DC Orders ED Discharge Orders          Ordered    sucralfate (CARAFATE) 1 g tablet  3 times daily with meals & bedtime        08/31/20 0101    ondansetron (ZOFRAN) 4 MG tablet  Every 8 hours PRN        08/31/20 0101             Hendryx Ricke, Boyd Kerbs 08/31/20 0108    Zadie Rhine, MD 08/31/20  0121  

## 2020-08-31 MED ORDER — SUCRALFATE 1 G PO TABS: 1.0000 g | ORAL_TABLET | Freq: Three times a day (TID) | ORAL | 0 refills | Status: DC

## 2020-08-31 MED ORDER — ONDANSETRON HCL 4 MG PO TABS: 4.0000 mg | ORAL_TABLET | Freq: Three times a day (TID) | ORAL | 0 refills | Status: DC | PRN

## 2020-08-31 NOTE — Discharge Instructions (Addendum)
1. Medications: Carafate, zofran for nausea; Continue protonix as directed; usual home medications 2. Treatment: rest, drink plenty of fluids, advance diet slowly 3. Follow Up: Please followup with your primary doctor in 1-2 days for discussion of your diagnoses and further evaluation after today's visit; if you do not have a primary care doctor use the resource guide provided to find one; Please return to the ER for persistent vomiting, worsening pain, high fevers or other concerns

## 2021-01-21 ENCOUNTER — Other Ambulatory Visit: Payer: Self-pay

## 2021-01-21 ENCOUNTER — Ambulatory Visit (HOSPITAL_COMMUNITY)
Admission: EM | Admit: 2021-01-21 | Discharge: 2021-01-21 | Discharge status: Against Medical Advice | Payer: BC Managed Care – PPO

## 2021-04-17 ENCOUNTER — Other Ambulatory Visit: Payer: Self-pay

## 2021-04-17 ENCOUNTER — Encounter (HOSPITAL_COMMUNITY): Payer: Self-pay

## 2021-04-17 ENCOUNTER — Ambulatory Visit (HOSPITAL_COMMUNITY)
Admission: EM | Admit: 2021-04-17 | Discharge: 2021-04-17 | Disposition: A | Discharge status: Home / Self Care | Payer: Medicaid Other | Attending: Urgent Care | Admitting: Urgent Care

## 2021-04-17 DIAGNOSIS — H6591 Unspecified nonsuppurative otitis media, right ear: Secondary | ICD-10-CM | POA: Diagnosis not present

## 2021-04-17 MED ORDER — FLUTICASONE PROPIONATE 50 MCG/ACT NA SUSP: 1.0000 | Freq: Two times a day (BID) | NASAL | 0 refills | Status: DC

## 2021-04-17 MED ORDER — AMOXICILLIN 500 MG PO CAPS: 1000.0000 mg | ORAL_CAPSULE | Freq: Two times a day (BID) | ORAL | 0 refills | Status: AC

## 2021-04-17 NOTE — ED Provider Notes (Signed)
[facility]    CSN: [id number] Arrival date & time: 04/17/21  1007      History   Chief Complaint Chief Complaint  Patient presents with   Ear Pain    HPI Ananiah Bourdon Rosenman is a 21 y.o. female.   Pleasant 20 year old female presents today with concerns of right ear pain x2 days.  She states for the past week she has been having upper respiratory symptoms including a slightly productive cough, postnasal drainage, sinus congestion.  She has been taking over-the-counter medication and saline nasal sprays which is helped somewhat, but reports over the past 2 days the ear on the right has been excruciatingly painful.  She denies any fever.  She denies any nuchal rigidity headache or neck pain.  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  She denies any chest pain, shortness of breath, palpitations.    Past Medical History:  Diagnosis Date   Rheumatoid arthritis (HCC)     There are no problems to display for this patient.   History reviewed. No pertinent surgical history.  OB History   No obstetric history on file.      Home Medications    Prior to Admission medications   Medication Sig Start Date End Date Taking? Authorizing Provider  amoxicillin (AMOXIL) 500 MG capsule Take 2 capsules (1,000 mg total) by mouth 2 (two) times daily for 10 days. 04/17/21 04/27/21 Yes Heavenlee Maiorana L, PA  fluticasone (FLONASE) 50 MCG/ACT nasal spray Place 1 spray into both nostrils in the morning and at bedtime. 04/17/21  Yes Antoinette Borgwardt L, PA  bismuth subsalicylate (PEPTO BISMOL) 262 MG/15ML suspension Take 45 mLs by mouth every 6 (six) hours as needed for indigestion or diarrhea or loose stools.    [provider]  cholecalciferol (VITAMIN D) 25 MCG (1000 UNIT) tablet Take 1,000 Units by mouth daily.    [provider]  etanercept (ENBREL) 50 MG/ML  injection Inject 50 mg into the skin every Tuesday. 07/24/19   [provider]  ibuprofen (ADVIL) 200 MG tablet Take 200 mg by mouth every 6 (six) hours as needed for headache.    [provider]  ondansetron (ZOFRAN) 4 MG tablet Take 1 tablet (4 mg total) by mouth every 8 (eight) hours as needed for nausea or vomiting. 08/31/20   Muthersbaugh, Dahlia Client, PA-C  pantoprazole (PROTONIX) 20 MG tablet Take 20 mg by mouth 2 (two) times daily. 05/31/20   [provider]  PROAIR HFA 108 (90 Base) MCG/ACT inhaler Inhale 1 puff into the lungs every 6 (six) hours as needed for shortness of breath. 08/25/20   [provider]  Probiotic Product (ALIGN) 4 MG CAPS Take 8 mg by mouth in the morning and at bedtime. 10/11/19   [provider]  sucralfate (CARAFATE) 1 g tablet Take 1 tablet (1 g total) by mouth 4 (four) times daily -  with meals and at bedtime. 08/31/20   Muthersbaugh, Dahlia Client, PA-C  Turmeric (QC TUMERIC COMPLEX PO) Take 1 capsule by mouth daily.    [provider]  vitamin B-12 (CYANOCOBALAMIN) 1000 MCG tablet Take 1,000 mcg by mouth daily.    [provider]  Burr Medico 150-35 MCG/24HR transdermal patch Place 1 patch onto the skin once a week. 08/04/20   [provider]    Family History History reviewed. No pertinent family history.  Social History Social History  Tobacco Use   Smoking status: Never  Substance Use Topics   Alcohol use: Never   Drug use: Never     Allergies   Patient has no known allergies.   Review of Systems Review of Systems  HENT:  Positive for congestion, ear pain, postnasal drip and sinus pressure.   Respiratory:  Positive for cough.   All other systems reviewed and are negative.   Physical Exam Triage Vital Signs ED Triage Vitals [04/17/21 1024]  Enc Vitals Group     BP 129/87     Pulse Rate 92     Resp 16     Temp 98 F (36.7 C)     Temp Source Oral     SpO2 98 %     Weight      Height       Head Circumference      Peak Flow      Pain Score      Pain Loc      Pain Edu?      Excl. in GC?    No data found.  Updated Vital Signs BP 129/87 (BP Location: Left Arm)    Pulse 92    Temp 98 F (36.7 C) (Oral)    Resp 16    LMP 04/17/2021    SpO2 98%   Visual Acuity Right Eye Distance:   Left Eye Distance:   Bilateral Distance:    Right Eye Near:   Left Eye Near:    Bilateral Near:     Physical Exam Vitals and nursing note reviewed. Exam conducted with a chaperone present.  Constitutional:      General: She is not in acute distress.    Appearance: Normal appearance. She is well-developed and normal weight. She is not ill-appearing, toxic-appearing or diaphoretic.  HENT:     Head: Normocephalic and atraumatic.     Right Ear: Ear canal and external ear normal. Decreased hearing noted. A middle ear effusion is present. There is no impacted cerumen. Tympanic membrane is erythematous and bulging. Tympanic membrane has decreased mobility.     Left Ear: Ear canal and external ear normal. A middle ear effusion is present. There is no impacted cerumen.     Nose: Nose normal. No congestion or rhinorrhea.     Mouth/Throat:     Mouth: Mucous membranes are moist.     Pharynx: Oropharynx is clear. No oropharyngeal exudate or posterior oropharyngeal erythema.  Eyes:     General: No scleral icterus.       Right eye: No discharge.        Left eye: No discharge.     Extraocular Movements: Extraocular movements intact.     Conjunctiva/sclera: Conjunctivae normal.     Pupils: Pupils are equal, round, and reactive to light.  Cardiovascular:     Rate and Rhythm: Normal rate and regular rhythm.     Heart sounds: No murmur heard. Pulmonary:     Effort: Pulmonary effort is normal. No respiratory distress.     Breath sounds: Normal breath sounds.  Abdominal:     Palpations: Abdomen is soft.     Tenderness: There is no abdominal tenderness.  Musculoskeletal:        General: No  swelling.     Cervical back: Normal range of motion and neck supple. No rigidity or tenderness.  Lymphadenopathy:     Cervical: No cervical adenopathy.  Skin:    General: Skin is warm and dry.  Capillary Refill: Capillary refill takes less than 2 seconds.  Neurological:     Mental Status: She is alert.  Psychiatric:        Mood and Affect: Mood normal.     UC Treatments / Results  Labs (all labs ordered are listed, but only abnormal results are displayed) Labs Reviewed - No data to display  EKG   Radiology No results found.  Procedures Procedures (including critical care time)  Medications Ordered in UC Medications - No data to display  Initial Impression / Assessment and Plan / UC Course  I have reviewed the triage vital signs and the nursing notes.  Pertinent labs & imaging results that were available during my care of the patient were reviewed by me and considered in my medical decision making (see chart for details).     OM with effusion, R - amoxicillin as prescribed. Add flonase to help with ET. Continue saline lavages in sinus passages. F/U with PCP in 2 weeks to ensure middle ear effusion has been completely resolved.  Final Clinical Impressions(s) / UC Diagnoses   Final diagnoses:  Otitis media with effusion, right     Discharge Instructions      You have a right ear infection, with a lot of fluid behind the eardrum. Please continue your saline nasal washes, we will also start Flonase nasal spray. Take all of your antibiotic as prescribed, until completed.  Consider taking a probiotic or yogurt to prevent any adverse reactions from the antibiotic. Follow-up with your primary care in 2 weeks to ensure full resolution of the ear fluid.     ED Prescriptions     Medication Sig Dispense Auth. Provider   fluticasone (FLONASE) 50 MCG/ACT nasal spray Place 1 spray into both nostrils in the morning and at bedtime. 16 g Obi Scrima L, PA    amoxicillin (AMOXIL) 500 MG capsule Take 2 capsules (1,000 mg total) by mouth 2 (two) times daily for 10 days. 40 capsule Yuliana Vandrunen L, PA      PDMP not reviewed this encounter.   Maretta Bees, [place] 04/17/21 1046

## 2021-04-17 NOTE — Discharge Instructions (Signed)
You have a right ear infection, with a lot of fluid behind the eardrum. Please continue your saline nasal washes, we will also start Flonase nasal spray. Take all of your antibiotic as prescribed, until completed.  Consider taking a probiotic or yogurt to prevent any adverse reactions from the antibiotic. Follow-up with your primary care in 2 weeks to ensure full resolution of the ear fluid.

## 2021-04-17 NOTE — ED Triage Notes (Signed)
Pt presents to the office for ear pain for several days.

## 2021-04-19 NOTE — Progress Notes (Signed)
NEW PATIENT Date of Service/Encounter:  04/21/21 Referring provider: Modesta Messing Primary care provider: Nathaneil Canary, PA-C  Subjective:  Tasha West is a 21 y.o. female with a PMHx of POTS presenting today for evaluation of rash. History obtained from: chart review and patient.  Concern for dog allergy:  Grew up with dogs Got a new dog in June, and when the puppy turned around 48 months old, the dog will jump on her or lick her and she will develop hives She was also starting to get "cold-like" symptoms and trouble breathing-like her lungs/chest were heavy.  It was hard for her to sleep.  The dog didn't sleep in her bed, but does occasionally get in the bed.   She also has 3 cats, but doesn't feel like they bother her. If she scratched at the hives, they would spread. Hives would last around 24 hours if she took a shower and put on benadryl cream She did send the dogs with her parents a few weeks ago, and feel her symptoms have significantly improved . She was able to stop zyrtec a few days after the dog left.   She does have an albuterol inhaler which she was given about 1.5 years ago during period of bronchitis.  She was having flares of SOB with exercise following this and used the albuterol which helped.  Hasn't used in months.  With the dog, she did not try albuterol but did not chest tightness and SOB when dog living in her home.   PCP visit on 03/05/2021-rash not improved with prednisone, itchiness improved with Zyrtec and Benadryl.   Other allergy screening: Rhino conjunctivitis: no Food allergy: no Medication allergy: no Hymenoptera allergy: no Eczema:no History of recurrent infections suggestive of immunodeficency: no Vaccinations are up to date.   Past Medical History: Past Medical History:  Diagnosis Date   Rheumatoid arthritis (HCC)    Medication List:  Current Outpatient Medications  Medication Sig Dispense Refill   amoxicillin (AMOXIL)  500 MG capsule Take 2 capsules (1,000 mg total) by mouth 2 (two) times daily for 10 days. 40 capsule 0   Azelastine HCl 0.15 % SOLN Place 2 sprays into both nostrils 2 (two) times daily. 30 mL 5   cetirizine (ZYRTEC ALLERGY) 10 MG tablet Take 1 tablet (10 mg total) by mouth daily. 30 tablet 12   cholecalciferol (VITAMIN D) 25 MCG (1000 UNIT) tablet Take 1,000 Units by mouth daily.     etanercept (ENBREL) 50 MG/ML injection Inject 50 mg into the skin every Tuesday.     FLUoxetine (PROZAC) 20 MG capsule Take 20 mg by mouth daily.     pantoprazole (PROTONIX) 20 MG tablet Take 20 mg by mouth 2 (two) times daily.     Probiotic Product (ALIGN) 4 MG CAPS Take 8 mg by mouth in the morning and at bedtime.     vitamin B-12 (CYANOCOBALAMIN) 1000 MCG tablet Take 1,000 mcg by mouth daily.     XULANE 150-35 MCG/24HR transdermal patch Place 1 patch onto the skin once a week.     bismuth subsalicylate (PEPTO BISMOL) 262 MG/15ML suspension Take 45 mLs by mouth every 6 (six) hours as needed for indigestion or diarrhea or loose stools. (Patient not taking: Reported on 04/21/2021)     fluticasone (FLONASE) 50 MCG/ACT nasal spray Place 1 spray into both nostrils in the morning and at bedtime. (Patient not taking: Reported on 04/21/2021) 16 g 0   ibuprofen (ADVIL) 200 MG tablet  Take 200 mg by mouth every 6 (six) hours as needed for headache. (Patient not taking: Reported on 04/21/2021)     ondansetron (ZOFRAN) 4 MG tablet Take 1 tablet (4 mg total) by mouth every 8 (eight) hours as needed for nausea or vomiting. (Patient not taking: Reported on 04/21/2021) 10 tablet 0   PROAIR HFA 108 (90 Base) MCG/ACT inhaler Inhale 2 puffs into the lungs every 4 (four) hours as needed for shortness of breath or wheezing (chest tightness). 1 each 3   sucralfate (CARAFATE) 1 g tablet Take 1 tablet (1 g total) by mouth 4 (four) times daily -  with meals and at bedtime. (Patient not taking: Reported on 04/21/2021) 30 tablet 0   Turmeric (QC  TUMERIC COMPLEX PO) Take 1 capsule by mouth daily. (Patient not taking: Reported on 04/21/2021)     No current facility-administered medications for this visit.   Known Allergies:  No Known Allergies Past Surgical History: Past Surgical History:  Procedure Laterality Date   TYMPANOSTOMY TUBE PLACEMENT     Family History: Family History  Problem Relation Age of Onset   Asthma Brother    Asthma Brother    Allergic rhinitis Brother    Social History: Chea lives in a house, no water damage, hardwood floors, Architectural technologist, central AC, pets: 3 cats (former dog), no cockroaches, not using DM protection on bedding, no smoke exposure, works as IT sales professional, no HEPA filter.   ROS:  All other systems negative except as noted per HPI.  Objective:  Blood pressure 110/74, pulse (!) 121, temperature 98.8 F (37.1 C), temperature source Temporal, resp. rate 16, height 5' 6.5" (1.689 m), weight 119 lb 3.2 oz (54.1 kg), last menstrual period 04/17/2021, SpO2 99 %. Body mass index is 18.95 kg/m. Physical Exam:  General Appearance:  Alert, cooperative, no distress, appears stated age  Head:  Normocephalic, without obvious abnormality, atraumatic  Eyes:  Conjunctiva clear, EOM's intact  Nose: Nares normal, normal mucosa, no visible anterior polyps, and septum midline  Throat: Lips, tongue normal; teeth and gums normal, normal posterior oropharynx  Neck: Supple, symmetrical  Lungs:   clear to auscultation bilaterally, Respirations unlabored, no coughing  Heart:  Slightly tachycardic, regular rhythm and no murmur, Appears well perfused  Extremities: No edema  Skin: Skin color, texture, turgor normal, no rashes or lesions on visualized portions of skin  Neurologic: No gross deficits     Diagnostics: Spirometry:  Tracings reviewed. Her effort: Good reproducible efforts. FVC: 3.42L  FEV1: 2.59L, 73% predicted FEV1/FVC ratio: 87%  Interpretation:  No obstruction noted, patient  asymptomatic    Skin Testing:  Cats and dogs only .  Adequate controls. Results discussed with patient/family.  Airborne Adult Perc - 04/21/21 0959     Time Antigen Placed 4431    Allergen Manufacturer Waynette Buttery    Location Arm    Number of Test 5    1. Control-Buffer 50% Glycerol Negative    2. Control-Histamine 1 mg/ml 3+    3. Albumin saline Negative    53. Cat Hair 10,000 BAU/ml Negative    54.  Dog Epithelia 2+             Allergy testing results were read and interpreted by myself, documented by clinical staff.  Assessment and Plan  Allergy testing borderline to dog but her symptoms are consistent with dog dander allergy and it sounds like likely intermittent asthma with flares around dog.  Discussed avoidance of dogs.  If wanting to  acquire new dog advised spending time around dog for at least a week to determine if will tolerate, alternatively can do allergy shots directed toward dogs.  Patient Instructions  Concern for Animal Dander Allergy: - allergy testing today was positive to dog, negative to cat - allergen avoidance as below - consider allergy shots as long term control of your symptoms by teaching your immune system to be more tolerant of your allergy triggers - Consider Astelin (Azelastine) 1-2 sprays in each nostril twice a day as needed.  You may use this as needed for nasal congestion/itchy ears/itchy nose if desired - Continue over the counter antihistamine daily or daily as needed.  Can take 1-2 tablets prior to going to a home with known animals to help minimize symptoms -Your options include Zyrtec (Cetirizine) 10mg , Claritin (Loratadine) 10mg , Allegra (Fexofenadine) 180mg , or Xyzal (Levocetirinze) 5mg    Hx of intermittent chest tightness/shortness of breath around dogs and occasional exercise-suspect intermittent asthma: - your lung testing today looked good, but you are asymptomatic at time of testing which does not rule out asthma - Rescue Inhaler:  Albuterol (Proair/Ventolin) 2 puffs . Use  every 4-6 hours as needed for chest tightness, wheezing, or coughing.  Can also use 15 minutes prior to exercise if you have symptoms with activity. Keep with you and use if around dogs and develop these symptoms. - Asthma is not controlled if:  - Symptoms are occurring >2 times a week OR  - >2 times a month nighttime awakenings  - You are requiring systemic steroids (prednisone/steroid injections) more than once per year  - Your require hospitalization for your asthma.  - Please call the clinic to schedule a follow up if these symptoms arise  Follow-up yearly, sooner if needed.  This note in its entirety was forwarded to the Provider who requested this consultation.  Thank you for your kind referral. I appreciate the opportunity to take part in [place] care. Please do not hesitate to contact me with questions.  Sincerely,  Tonny Bollman, MD [facility]

## 2021-04-21 ENCOUNTER — Encounter: Payer: Self-pay | Admitting: Internal Medicine

## 2021-04-21 ENCOUNTER — Other Ambulatory Visit: Payer: Self-pay

## 2021-04-21 ENCOUNTER — Ambulatory Visit (INDEPENDENT_AMBULATORY_CARE_PROVIDER_SITE_OTHER): Payer: Medicaid Other | Admitting: Internal Medicine

## 2021-04-21 VITALS — BP 110/74 | HR 121 | Temp 98.8°F | Resp 16 | Ht 66.5 in | Wt 119.2 lb

## 2021-04-21 DIAGNOSIS — L508 Other urticaria: Secondary | ICD-10-CM

## 2021-04-21 DIAGNOSIS — R0602 Shortness of breath: Secondary | ICD-10-CM

## 2021-04-21 DIAGNOSIS — J3081 Allergic rhinitis due to animal (cat) (dog) hair and dander: Secondary | ICD-10-CM | POA: Diagnosis not present

## 2021-04-21 DIAGNOSIS — L2381 Allergic contact dermatitis due to animal (cat) (dog) dander: Secondary | ICD-10-CM

## 2021-04-21 DIAGNOSIS — J310 Chronic rhinitis: Secondary | ICD-10-CM | POA: Diagnosis not present

## 2021-04-21 MED ORDER — AZELASTINE HCL 0.15 % NA SOLN: 2.0000 | Freq: Two times a day (BID) | NASAL | 5 refills | Status: DC

## 2021-04-21 MED ORDER — PROAIR HFA 108 (90 BASE) MCG/ACT IN AERS: 2.0000 | INHALATION_SPRAY | RESPIRATORY_TRACT | 3 refills | Status: DC | PRN

## 2021-04-21 MED ORDER — CETIRIZINE HCL 10 MG PO TABS: 10.0000 mg | ORAL_TABLET | Freq: Every day | ORAL | 12 refills | Status: DC

## 2021-04-21 NOTE — Patient Instructions (Addendum)
Concern for Animal Dander Allergy: - allergy testing today was positive to dog, negative to cat - allergen avoidance as below - consider allergy shots as long term control of your symptoms by teaching your immune system to be more tolerant of your allergy triggers - Consider Astelin (Azelastine) 1-2 sprays in each nostril twice a day as needed.  You may use this as needed for nasal congestion/itchy ears/itchy nose if desired - Continue over the counter antihistamine daily or daily as needed.  Can take 1-2 tablets prior to going to a home with known animals to help minimize symptoms -Your options include Zyrtec (Cetirizine) 10mg , Claritin (Loratadine) 10mg , Allegra (Fexofenadine) 180mg , or Xyzal (Levocetirinze) 5mg    Hx of intermittent chest tightness/shortness of breath around dogs and occasional exercise-suspect intermittent asthma: - your lung testing today looked good, but you are asymptomatic at time of testing which does not rule out asthma - Rescue Inhaler: Albuterol (Proair/Ventolin) 2 puffs . Use  every 4-6 hours as needed for chest tightness, wheezing, or coughing.  Can also use 15 minutes prior to exercise if you have symptoms with activity. Keep with you and use if around dogs and develop these symptoms. - Asthma is not controlled if:  - Symptoms are occurring >2 times a week OR  - >2 times a month nighttime awakenings  - You are requiring systemic steroids (prednisone/steroid injections) more than once per year  - Your require hospitalization for your asthma.  - Please call the clinic to schedule a follow up if these symptoms arise  Follow-up yearly, sooner if needed.  Control of Dog or Cat Allergen  Avoidance is the best way to manage a dog or cat allergy. If you have a dog or cat and are allergic to dog or cats, consider removing the dog or cat from the home. If you have a dog or cat but dont want to find it a new home, or if your family wants a pet even though someone in the  household is allergic, here are some strategies that may help keep symptoms at bay:  Keep the pet out of your bedroom and restrict it to only a few rooms. Be advised that keeping the dog or cat in only one room will not limit the allergens to that room. Dont pet, hug or kiss the dog or cat; if you do, wash your hands with soap and water. High-efficiency particulate air (HEPA) cleaners run continuously in a bedroom or living room can reduce allergen levels over time. Regular use of a high-efficiency vacuum cleaner or a central vacuum can reduce allergen levels. Giving your dog or cat a bath at least once a week can reduce airborne allergen.

## 2021-04-26 ENCOUNTER — Other Ambulatory Visit: Payer: Self-pay | Admitting: *Deleted

## 2021-04-26 MED ORDER — VENTOLIN HFA 108 (90 BASE) MCG/ACT IN AERS: INHALATION_SPRAY | RESPIRATORY_TRACT | 1 refills | Status: AC

## 2021-12-12 ENCOUNTER — Encounter (HOSPITAL_COMMUNITY): Payer: Self-pay

## 2021-12-12 ENCOUNTER — Emergency Department (HOSPITAL_COMMUNITY): Payer: Medicaid Other

## 2021-12-12 ENCOUNTER — Emergency Department (HOSPITAL_COMMUNITY)
Admission: EM | Admit: 2021-12-12 | Discharge: 2021-12-12 | Disposition: A | Discharge status: Home / Self Care | Payer: Medicaid Other | Attending: Emergency Medicine | Admitting: Emergency Medicine

## 2021-12-12 ENCOUNTER — Other Ambulatory Visit: Payer: Self-pay

## 2021-12-12 DIAGNOSIS — Z1152 Encounter for screening for COVID-19: Secondary | ICD-10-CM | POA: Diagnosis not present

## 2021-12-12 DIAGNOSIS — Z79899 Other long term (current) drug therapy: Secondary | ICD-10-CM | POA: Diagnosis not present

## 2021-12-12 DIAGNOSIS — J069 Acute upper respiratory infection, unspecified: Secondary | ICD-10-CM | POA: Diagnosis not present

## 2021-12-12 DIAGNOSIS — J029 Acute pharyngitis, unspecified: Secondary | ICD-10-CM | POA: Diagnosis present

## 2021-12-12 LAB — URINALYSIS, ROUTINE W REFLEX MICROSCOPIC
Bacteria, UA: NONE SEEN
Bilirubin Urine: NEGATIVE
Glucose, UA: NEGATIVE mg/dL
Hgb urine dipstick: NEGATIVE
Ketones, ur: 20 mg/dL — AB
Leukocytes,Ua: NEGATIVE
Nitrite: NEGATIVE
Protein, ur: 30 mg/dL — AB
Specific Gravity, Urine: 1.012 (ref 1.005–1.030)
pH: 5 (ref 5.0–8.0)

## 2021-12-12 LAB — CBC WITH DIFFERENTIAL/PLATELET
Abs Immature Granulocytes: 0.09 10*3/uL — ABNORMAL HIGH (ref 0.00–0.07)
Basophils Absolute: 0 10*3/uL (ref 0.0–0.1)
Basophils Relative: 0 %
Eosinophils Absolute: 0.1 10*3/uL (ref 0.0–0.5)
Eosinophils Relative: 0 %
HCT: 32 % — ABNORMAL LOW (ref 36.0–46.0)
Hemoglobin: 10.1 g/dL — ABNORMAL LOW (ref 12.0–15.0)
Immature Granulocytes: 1 %
Lymphocytes Relative: 3 %
Lymphs Abs: 0.5 10*3/uL — ABNORMAL LOW (ref 0.7–4.0)
MCH: 23.3 pg — ABNORMAL LOW (ref 26.0–34.0)
MCHC: 31.6 g/dL (ref 30.0–36.0)
MCV: 73.7 fL — ABNORMAL LOW (ref 80.0–100.0)
Monocytes Absolute: 1.1 10*3/uL — ABNORMAL HIGH (ref 0.1–1.0)
Monocytes Relative: 6 %
Neutro Abs: 15.3 10*3/uL — ABNORMAL HIGH (ref 1.7–7.7)
Neutrophils Relative %: 90 %
Platelets: 324 10*3/uL (ref 150–400)
RBC: 4.34 MIL/uL (ref 3.87–5.11)
RDW: 15.1 % (ref 11.5–15.5)
WBC: 17 10*3/uL — ABNORMAL HIGH (ref 4.0–10.5)
nRBC: 0 % (ref 0.0–0.2)

## 2021-12-12 LAB — COMPREHENSIVE METABOLIC PANEL
ALT: 13 U/L (ref 0–44)
AST: 16 U/L (ref 15–41)
Albumin: 3.4 g/dL — ABNORMAL LOW (ref 3.5–5.0)
Alkaline Phosphatase: 39 U/L (ref 38–126)
Anion gap: 13 (ref 5–15)
BUN: 5 mg/dL — ABNORMAL LOW (ref 6–20)
CO2: 20 mmol/L — ABNORMAL LOW (ref 22–32)
Calcium: 8.6 mg/dL — ABNORMAL LOW (ref 8.9–10.3)
Chloride: 102 mmol/L (ref 98–111)
Creatinine, Ser: 0.82 mg/dL (ref 0.44–1.00)
GFR, Estimated: >60 mL/min (ref >60)
Glucose, Bld: 128 mg/dL — ABNORMAL HIGH (ref 70–99)
Potassium: 4 mmol/L (ref 3.5–5.1)
Sodium: 135 mmol/L (ref 135–145)
Total Bilirubin: 0.8 mg/dL (ref 0.3–1.2)
Total Protein: 6.6 g/dL (ref 6.5–8.1)

## 2021-12-12 LAB — I-STAT BETA HCG BLOOD, ED (MC, WL, AP ONLY): I-stat hCG, quantitative: <5 m[IU]/mL (ref <5)

## 2021-12-12 LAB — LACTIC ACID, PLASMA
Lactic Acid, Venous: 1.6 mmol/L (ref 0.5–1.9)
Lactic Acid, Venous: 1.8 mmol/L (ref 0.5–1.9)

## 2021-12-12 LAB — RESP PANEL BY RT-PCR (FLU A&B, COVID) ARPGX2
Influenza A by PCR: NEGATIVE
Influenza B by PCR: NEGATIVE
SARS Coronavirus 2 by RT PCR: NEGATIVE

## 2021-12-12 MED ORDER — ACETAMINOPHEN 325 MG PO TABS
650.0000 mg | ORAL_TABLET | Freq: Once | ORAL | Status: AC
  Administered 2021-12-12: 650 mg via ORAL
  Filled 2021-12-12: qty 2

## 2021-12-12 MED ORDER — LACTATED RINGERS IV BOLUS: 500.0000 mL | Freq: Once | INTRAVENOUS | Status: DC

## 2021-12-12 MED ORDER — ONDANSETRON HCL 4 MG/2ML IJ SOLN
4.0000 mg | Freq: Once | INTRAMUSCULAR | Status: AC
  Administered 2021-12-12: 4 mg via INTRAVENOUS
  Filled 2021-12-12: qty 2

## 2021-12-12 MED ORDER — ONDANSETRON HCL 4 MG PO TABS: 4.0000 mg | ORAL_TABLET | Freq: Four times a day (QID) | ORAL | 0 refills | Status: DC

## 2021-12-12 MED ORDER — LACTATED RINGERS IV BOLUS
1000.0000 mL | Freq: Once | INTRAVENOUS | Status: AC
  Administered 2021-12-12: 1000 mL via INTRAVENOUS

## 2021-12-12 MED ORDER — IBUPROFEN 400 MG PO TABS
600.0000 mg | ORAL_TABLET | Freq: Once | ORAL | Status: AC
  Administered 2021-12-12: 600 mg via ORAL
  Filled 2021-12-12: qty 1

## 2021-12-12 MED ORDER — DEXAMETHASONE SODIUM PHOSPHATE 10 MG/ML IJ SOLN
10.0000 mg | Freq: Once | INTRAMUSCULAR | Status: AC
  Administered 2021-12-12: 10 mg via INTRAMUSCULAR
  Filled 2021-12-12: qty 1

## 2021-12-12 NOTE — ED Triage Notes (Signed)
Patient complains of joint pain, fever, headache and congestion since Friday, took covid test and was neg. Alert and oriented

## 2021-12-12 NOTE — ED Provider Notes (Signed)
[facility] EMERGENCY DEPARTMENT Provider Note   CSN: [id number] Arrival date & time: 12/12/21  7494     History  Chief Complaint  Patient presents with   Sore Throat    Tasha West is a 21 y.o. female.  With past medical history of rheumatoid arthritis who presents to the emergency department with fever, sore throat.  States that symptoms began on Friday evening.  States that she began having initially a sore throat on Friday.  States that then Saturday she began having joint pains, body aches particularly in her back and legs, headache, nonproductive cough, nausea, vomiting and diarrhea.  States that she has had in total about 5 episodes of nonbloody vomiting as well as 5 episodes of nonbloody diarrhea.  She has had minimal p.o. intake over the past 2 days.  She states that she has had fever up to 101 at home and has been taking Tylenol.  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 Denies vaginal discharge, dysuria. Denies recent travel, hiking or camping.   HPI     Home Medications Prior to Admission medications   Medication Sig Start Date End Date Taking? Authorizing Provider  ondansetron (ZOFRAN) 4 MG tablet Take 1 tablet (4 mg total) by mouth every 6 (six) hours. 12/12/21  Yes Mickie Hillier, PA-C  Azelastine HCl 0.15 % SOLN Place 2 sprays into both nostrils 2 (two) times daily. 04/21/21   Clemon Chambers, MD  bismuth subsalicylate (PEPTO BISMOL) 262 MG/15ML suspension Take 45 mLs by mouth every 6 (six) hours as needed for indigestion or diarrhea or loose stools. Patient not taking: Reported on 04/21/2021    [provider]  cetirizine (ZYRTEC ALLERGY) 10 MG tablet Take 1 tablet (10 mg total) by mouth daily. 04/21/21   Clemon Chambers, MD  cholecalciferol (VITAMIN D) 25 MCG (1000 UNIT) tablet Take 1,000 Units by mouth daily.    [provider]  etanercept (ENBREL) 50 MG/ML injection Inject 50 mg into the skin every  Tuesday. 07/24/19   [provider]  FLUoxetine (PROZAC) 20 MG capsule Take 20 mg by mouth daily. 04/14/21   [provider]  fluticasone (FLONASE) 50 MCG/ACT nasal spray Place 1 spray into both nostrils in the morning and at bedtime. Patient not taking: Reported on 04/21/2021 04/17/21   Geryl Councilman L, PA  ibuprofen (ADVIL) 200 MG tablet Take 200 mg by mouth every 6 (six) hours as needed for headache. Patient not taking: Reported on 04/21/2021    [provider]  ondansetron (ZOFRAN) 4 MG tablet Take 1 tablet (4 mg total) by mouth every 8 (eight) hours as needed for nausea or vomiting. Patient not taking: Reported on 04/21/2021 08/31/20   Muthersbaugh, Jarrett Soho, PA-C  pantoprazole (PROTONIX) 20 MG tablet Take 20 mg by mouth 2 (two) times daily. 05/31/20   [provider]  Probiotic Product (ALIGN) 4 MG CAPS Take 8 mg by mouth in the morning and at bedtime. 10/11/19   [provider]  sucralfate (CARAFATE) 1 g tablet Take 1 tablet (1 g total) by mouth 4 (four) times daily -  with meals and at bedtime. Patient not taking: Reported on 04/21/2021 08/31/20   Muthersbaugh, Jarrett Soho, PA-C  Turmeric (QC TUMERIC COMPLEX PO) Take 1 capsule by mouth daily. Patient not taking: Reported on 04/21/2021    [provider]  VENTOLIN HFA 108 (90 Base) MCG/ACT inhaler Inhale two puffs every 4-6 hours if needed for  cough or wheeze. 04/26/21   Clemon Chambers, MD  vitamin B-12 (CYANOCOBALAMIN) 1000 MCG tablet Take 1,000 mcg by mouth daily.    [provider]  [name] 150-35 MCG/24HR transdermal patch Place 1 patch onto the skin once a week. 08/04/20   [provider]      Allergies    Patient has no known allergies.    Review of Systems   Review of Systems  Constitutional:  Positive for appetite change, fatigue and fever.  HENT:  Positive for sore throat.   Respiratory:  Positive for cough. Negative for shortness of breath.   Cardiovascular:  Negative for  chest pain.  Gastrointestinal:  Positive for diarrhea, nausea and vomiting.  Genitourinary:  Negative for dysuria and vaginal discharge.  All other systems reviewed and are negative.   Physical Exam Updated Vital Signs BP 105/68   Pulse (!) 106   Temp 100.1 F (37.8 C)   Resp 16   SpO2 99%  Physical Exam Vitals and nursing note reviewed.  Constitutional:      Appearance: She is ill-appearing.  HENT:     Head: Normocephalic.     Nose: Nose normal.     Mouth/Throat:     Mouth: Mucous membranes are moist.     Pharynx: Uvula midline. Posterior oropharyngeal erythema present. No oropharyngeal exudate.     Tonsils: No tonsillar exudate or tonsillar abscesses. 0 on the right. 0 on the left.  Eyes:     General: No scleral icterus.    Extraocular Movements: Extraocular movements intact.     Comments: Conjunctiva pale   Cardiovascular:     Rate and Rhythm: Regular rhythm. Tachycardia present.     Pulses: Normal pulses.     Heart sounds: No murmur heard. Pulmonary:     Effort: Pulmonary effort is normal. No respiratory distress.     Breath sounds: Normal breath sounds.  Abdominal:     General: Abdomen is flat. Bowel sounds are normal. There is no distension.     Palpations: Abdomen is soft.     Tenderness: There is no abdominal tenderness.  Musculoskeletal:        General: No swelling. Normal range of motion.     Cervical back: Neck supple.  Skin:    General: Skin is warm and dry.     Capillary Refill: Capillary refill takes less than 2 seconds.     Findings: No rash.  Neurological:     General: No focal deficit present.     Mental Status: She is alert and oriented to person, place, and time. Mental status is at baseline.  Psychiatric:        Mood and Affect: Mood normal.        Behavior: Behavior normal.        Thought Content: Thought content normal.        Judgment: Judgment normal.     ED Results / Procedures / Treatments   Labs (all labs ordered are listed, but  only abnormal results are displayed) Labs Reviewed  COMPREHENSIVE METABOLIC PANEL - Abnormal; Notable for the following components:      Result Value   CO2 20 (*)    Glucose, Bld 128 (*)    BUN 5 (*)    Calcium 8.6 (*)    Albumin 3.4 (*)    All other components within normal limits  CBC WITH DIFFERENTIAL/PLATELET - Abnormal; Notable for the following components:   WBC 17.0 (*)    Hemoglobin 10.1 (*)  HCT 32.0 (*)    MCV 73.7 (*)    MCH 23.3 (*)    Neutro Abs 15.3 (*)    Lymphs Abs 0.5 (*)    Monocytes Absolute 1.1 (*)    Abs Immature Granulocytes 0.09 (*)    All other components within normal limits  URINALYSIS, ROUTINE W REFLEX MICROSCOPIC - Abnormal; Notable for the following components:   APPearance HAZY (*)    Ketones, ur 20 (*)    Protein, ur 30 (*)    All other components within normal limits  CULTURE, BLOOD (ROUTINE X 2)  CULTURE, BLOOD (ROUTINE X 2)  RESP PANEL BY RT-PCR (FLU A&B, COVID) ARPGX2  LACTIC ACID, PLASMA  LACTIC ACID, PLASMA  I-STAT BETA HCG BLOOD, ED ([facility], [facility], [facility] ONLY)    EKG EKG Interpretation  Date/Time:  Sunday December 12 2021 10:17:01 EDT Ventricular Rate:  141 PR Interval:  120 QRS Duration: 78 QT Interval:  356 QTC Calculation: 545 R Axis:   88 Text Interpretation: Sinus tachycardia ST & Marked T wave abnormality, consider inferior ischemia Abnormal ECG  Inferoseptal T-wave inversions likely demand 2/2 rate new compared to prior EKG Confirmed by Leanord Asal ([id number]) on 12/12/2021 2:02:16 PM  Radiology No results found.  Procedures Procedures   Medications Ordered in ED Medications  lactated ringers bolus 1,000 mL (0 mLs Intravenous Stopped 12/12/21 1526)  ondansetron (ZOFRAN) injection 4 mg (4 mg Intravenous Given 12/12/21 1204)  acetaminophen (TYLENOL) tablet 650 mg (650 mg Oral Given 12/12/21 1522)  dexamethasone (DECADRON) injection 10 mg (10 mg Intramuscular Given 12/12/21 1522)  ibuprofen (ADVIL) tablet 600 mg (600 mg Oral Given  12/12/21 1522)    ED Course/ Medical Decision Making/ A&P                           Medical Decision Making Amount and/or Complexity of Data Reviewed Labs: ordered. Radiology: ordered.  Risk OTC drugs. Prescription drug management.  This patient presents to the ED with chief complaint(s) of sore throat and fever with pertinent past medical history of RA which further complicates the presenting complaint. The complaint involves an extensive differential diagnosis and also carries with it a high risk of complications and morbidity.    The differential diagnosis includes strep pharyngitis, viral URI including COVID, flu, pneumonia, EBV, RPA, PTA, epiglottitis, acute HIV, rheumatoid, etc.    Additional history obtained: Additional history obtained from significant other Records reviewed Care Everywhere/External Records and Primary Care Documents  ED Course and Reassessment: 21 year old female who presents to the emergency department with sore throat, cough, body aches, N/V/D  Overall she is ill appearing but non-septic, non-toxic in appearance. She has an erythematous posterior oropharynx without tonsillar swelling or exudates. Theres no evidence of PTA, RPA, ludwig's angina. No joints that are erythematous, swollen, or hot concerning for septic joint in the setting of known RA. Abdomen is non-peritonitic. Lungs are clear. No rashes.  Given that she was tachy to 140s on presentation with low grade fever, initiated a septic work up as she met SIRS criteria.  We gave her 1L IVF which decreased her HR to 110s. EKG does not show any ischemia/infarction. No arrhythmia.  Given cough, fever, obtained CXR which shows no abnormalities including no PNA, PTX, effusion. No enlarged heart.   Her labs are significant for leukocytosis to 17. Her lactic is negative. UA without UTI. Not pregnant.   COVID And flu negative.  Considered adding on strep, but [facility]  criteria low, symptoms are  inconsistent with this and will not test. Symptoms only began 2 days ago, so will not test for EBV.  On reexamination she looks improved after fluids, zofran, tylenol. I discussed findings with attending and the attending also evaluated the patient given her initial presentation. She recommends decadron and ibuprofen prior to d/c. Likely a viral URI with cough. Do not feel she requires admission or further work up at this time. I did review her medical records which show that she is tachycardic quiet often to 110s in the OP setting. She states she is being worked up currently for POTS. May be driving the consistent tachycardia rather than acute illness. This may be her baseline.   She is given strict return precautions for worsening symptoms. Instructed to push PO fluids, bland diet, NSAIDs and tylenol. She verbalizes understanding. Feel she is safe for discharge at this time.   Independent labs interpretation:  The following labs were independently interpreted: WBC 17, not pregnant, COVID/flu negative, lactic negative, UA negative   Independent visualization of imaging: - I independently visualized the following imaging with scope of interpretation limited to determining acute life threatening conditions related to emergency care: CXR, which revealed without pneumonia, pleural effusion, PTX  Consultation: - Consulted or discussed management/test interpretation w/ external professional: not indicated   Consideration for admission or further workup: considered ongoing work up with EBV, strep, etc. However symptoms inconsistent and feel this is all virally driven at this time  Social Determinants of health: none identified  Final Clinical Impression(s) / ED Diagnoses Final diagnoses:  Viral URI with cough    Rx / DC Orders ED Discharge Orders          Ordered    ondansetron (ZOFRAN) 4 MG tablet  Every 6 hours        12/12/21 1525              Mickie Hillier, PA-C 12/15/21 1306     Leanord Asal K, DO 12/16/21 1734

## 2021-12-12 NOTE — Discharge Instructions (Signed)
You were seen in the emergency department today for cough, sore throat and fever.  You likely have a viral upper respiratory infection.  You do not have COVID of the fluid as of today.  I have prescribed you Zofran for nausea and vomiting.  Please drink plenty of fluids at home.  Please use Tylenol and Motrin for pain and fever.  Please return to the emergency department if you are unable to keep liquids down, your heart rate becomes high like it was today or you had fever that is not responsive to Tylenol or Motrin or become short of breath.

## 2021-12-17 LAB — CULTURE, BLOOD (ROUTINE X 2)
Culture: NO GROWTH
Culture: NO GROWTH
Special Requests: ADEQUATE
Special Requests: ADEQUATE

## 2021-12-30 NOTE — Progress Notes (Deleted)
Office Visit Note  Patient: Tasha West             Date of Birth: Mar 28, 2000           MRN: [id number]             PCP: Sue Lush, PA-C Referring: Deliah Goody, MD Visit Date: 01/13/2022 Occupation: @GUAROCC @  Subjective:  No chief complaint on file.   History of Present Illness: Tasha West is a 21 y.o. female ***   Activities of Daily Living:  Patient reports morning stiffness for *** {minute/hour:19697}.   Patient {ACTIONS;DENIES/REPORTS:21021675::"Denies"} nocturnal pain.  Difficulty dressing/grooming: {ACTIONS;DENIES/REPORTS:21021675::"Denies"} Difficulty climbing stairs: {ACTIONS;DENIES/REPORTS:21021675::"Denies"} Difficulty getting out of chair: {ACTIONS;DENIES/REPORTS:21021675::"Denies"} Difficulty using hands for taps, buttons, cutlery, and/or writing: {ACTIONS;DENIES/REPORTS:21021675::"Denies"}  No Rheumatology ROS completed.   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   Objective: Vital Signs: There were no vitals taken for this visit.   Physical Exam   Musculoskeletal Exam: ***  CDAI Exam: CDAI Score: -- Patient Global: --; Provider Global: -- Swollen: --; Tender: -- Joint Exam 01/13/2022   No joint exam has been documented for this visit   There is currently no information documented on the homunculus. Go to the Rheumatology activity and complete the homunculus joint exam.  Investigation: No additional findings.  Imaging: DG Chest 2  View  Result Date: 12/12/2021 CLINICAL DATA:  Fever, vomiting, diarrhea EXAM: CHEST - 2 VIEW COMPARISON:  Chest x-ray August 30, 2020 FINDINGS: The heart size and mediastinal contours are within normal limits. Both lungs are clear. No acute osseous abnormality. The visualized upper abdomen is unremarkable. IMPRESSION: No acute cardiopulmonary abnormality. Electronically Signed   By: Beryle Flock M.D.   On: 12/12/2021 10:50    Recent Labs: Lab Results  Component Value Date   WBC 17.0 (H) 12/12/2021   HGB 10.1 (L) 12/12/2021   PLT 324 12/12/2021   NA 135 12/12/2021   K 4.0 12/12/2021   CL 102 12/12/2021   CO2 20 (L) 12/12/2021   GLUCOSE 128 (H) 12/12/2021   BUN 5 (L) 12/12/2021   CREATININE 0.82 12/12/2021   BILITOT 0.8 12/12/2021   ALKPHOS 39 12/12/2021   AST 16 12/12/2021   ALT 13 12/12/2021   PROT 6.6 12/12/2021   ALBUMIN 3.4 (L) 12/12/2021   CALCIUM 8.6 (L) 12/12/2021   GFRAA >60 01/02/2019    Speciality Comments: No specialty comments available.  Procedures:  No procedures performed Allergies: Patient has no known allergies.   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 No erosive changes.  High risk medication use - Enbrel. TB gold negative on 03/04/20  TMJ tenderness, right - Previously injected?  Raynaud's syndrome without gangrene  History of gastroesophageal reflux (GERD)  History of IBS  History of iron deficiency  anemia  History of gastritis  Vitamin D deficiency  Orders: No orders of the defined types were placed in this encounter.  No orders of the defined types were placed in this encounter.   Face-to-face time spent with patient was *** minutes. Greater than 50% of time was spent in counseling and coordination of care.  Follow-Up Instructions: No follow-ups on file.   Ofilia Neas, PA-C  Note - This record has been  created using Dragon software.  Chart creation errors have been sought, but may not always  have been located. Such creation errors do not reflect on  the standard of medical care.

## 2022-01-13 ENCOUNTER — Ambulatory Visit: Payer: Medicaid Other | Admitting: Rheumatology

## 2022-01-13 DIAGNOSIS — I7300 Raynaud's syndrome without gangrene: Secondary | ICD-10-CM

## 2022-01-13 DIAGNOSIS — M0579 Rheumatoid arthritis with rheumatoid factor of multiple sites without organ or systems involvement: Secondary | ICD-10-CM

## 2022-01-13 DIAGNOSIS — E559 Vitamin D deficiency, unspecified: Secondary | ICD-10-CM

## 2022-01-13 DIAGNOSIS — Z79899 Other long term (current) drug therapy: Secondary | ICD-10-CM

## 2022-01-13 DIAGNOSIS — Z862 Personal history of diseases of the blood and blood-forming organs and certain disorders involving the immune mechanism: Secondary | ICD-10-CM

## 2022-01-13 DIAGNOSIS — M26621 Arthralgia of right temporomandibular joint: Secondary | ICD-10-CM

## 2022-01-13 DIAGNOSIS — Z8719 Personal history of other diseases of the digestive system: Secondary | ICD-10-CM

## 2022-02-09 ENCOUNTER — Ambulatory Visit: Payer: Medicaid Other | Admitting: Rheumatology

## 2022-02-11 IMAGING — CR DG ABDOMEN ACUTE W/ 1V CHEST
3 series · 3 of 3 positions shown · non-contrast
Comparison: None.

CLINICAL DATA: Right upper quadrant pain

EXAM:
DG ABDOMEN ACUTE WITH 1 VIEW CHEST

[abdomen erect]
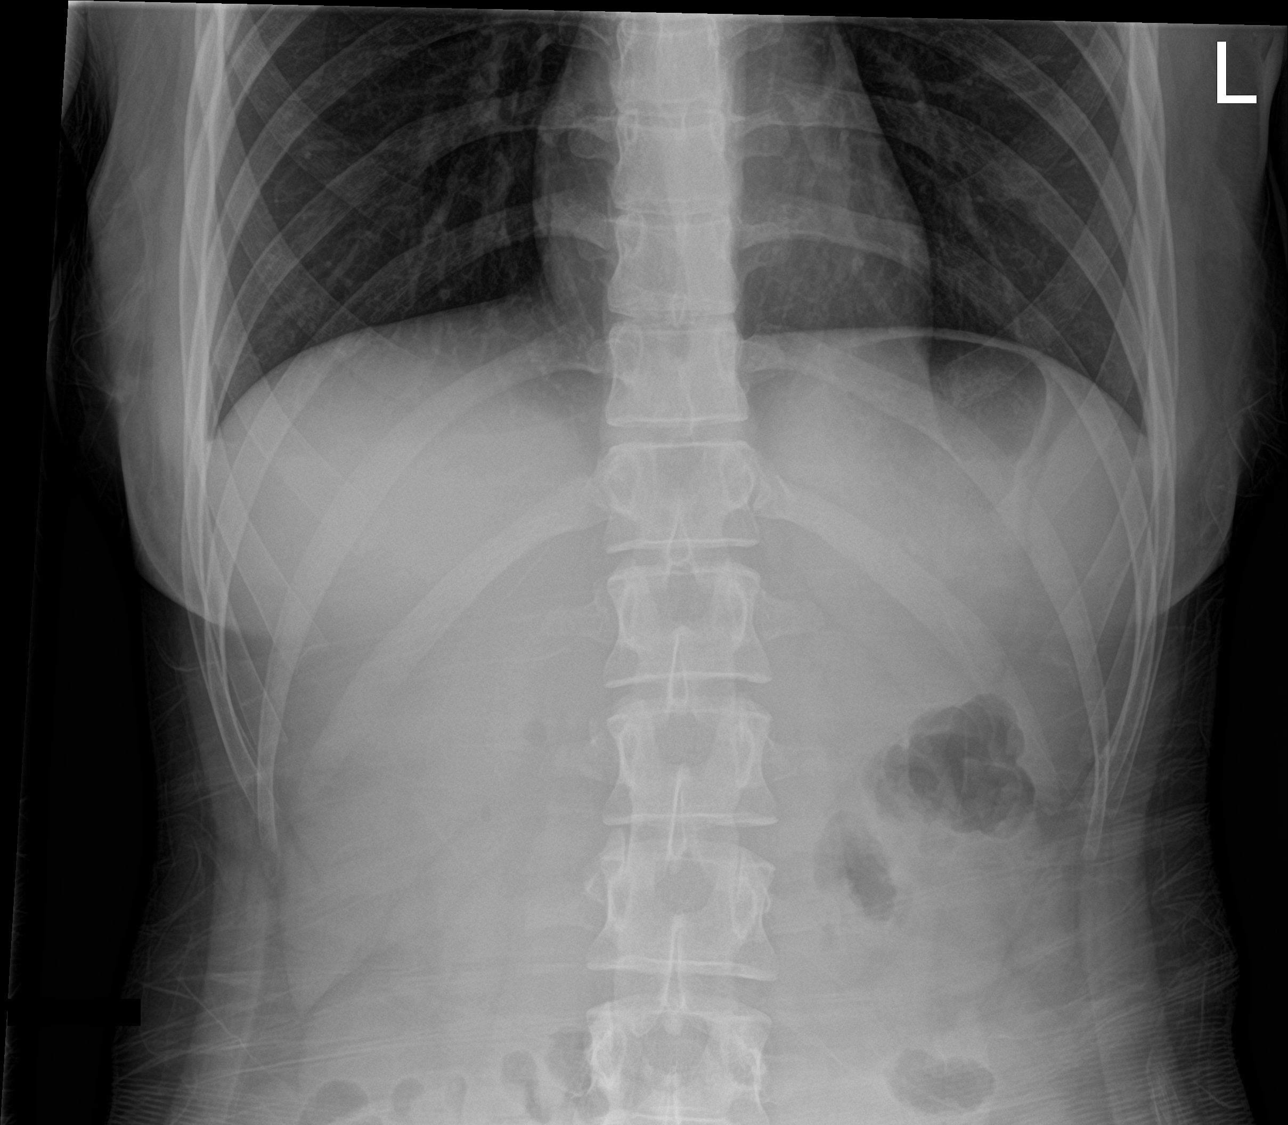

[abdomen supine]
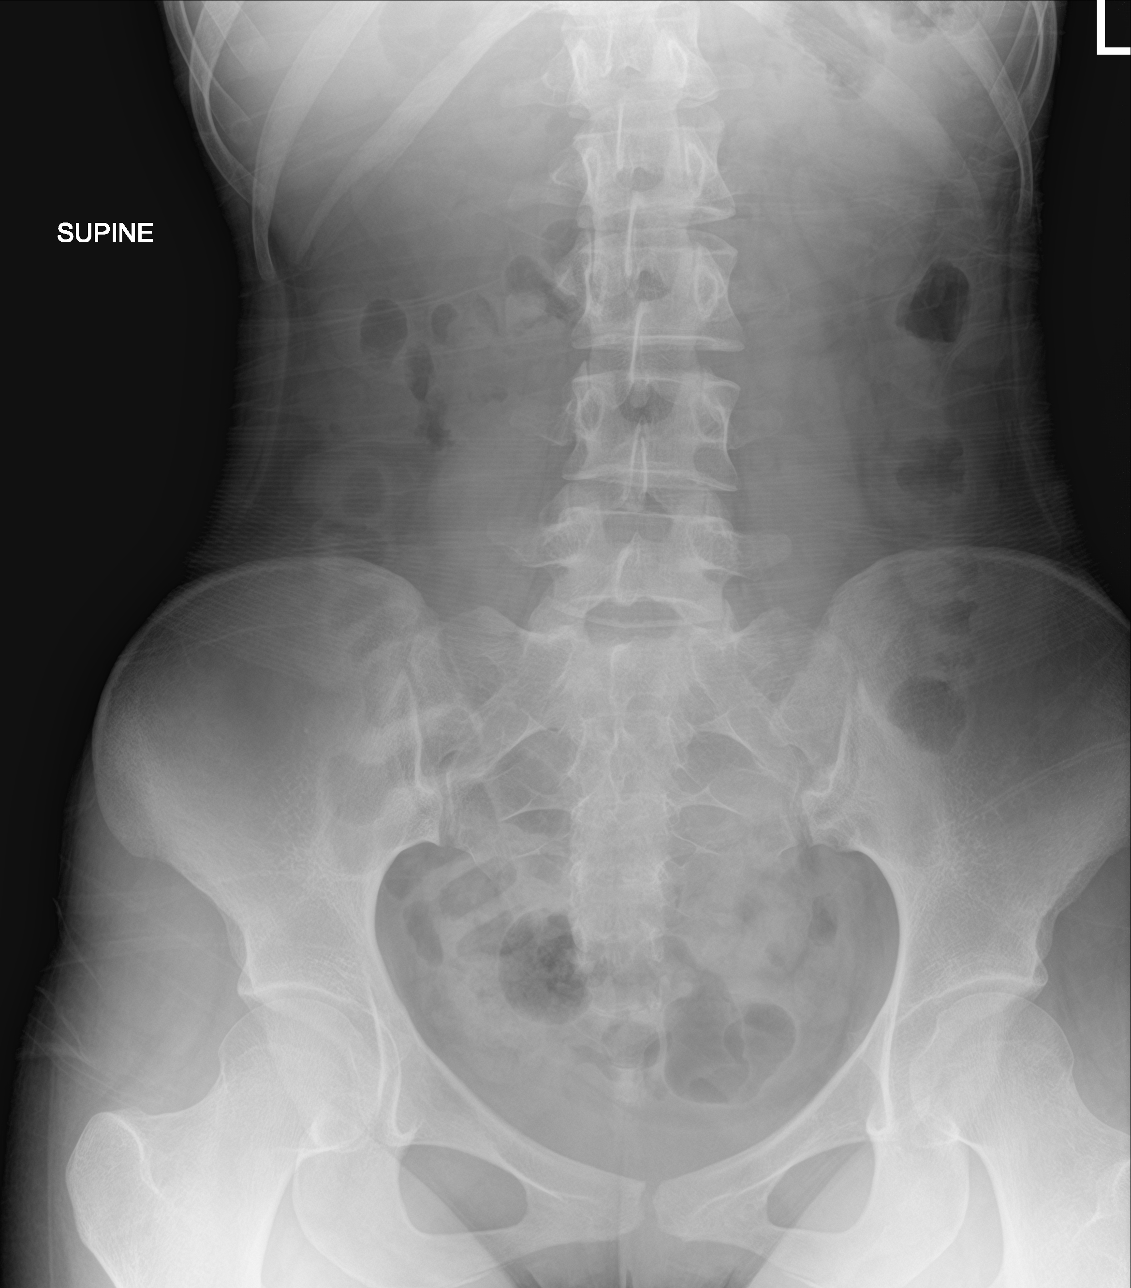

[chest ap]
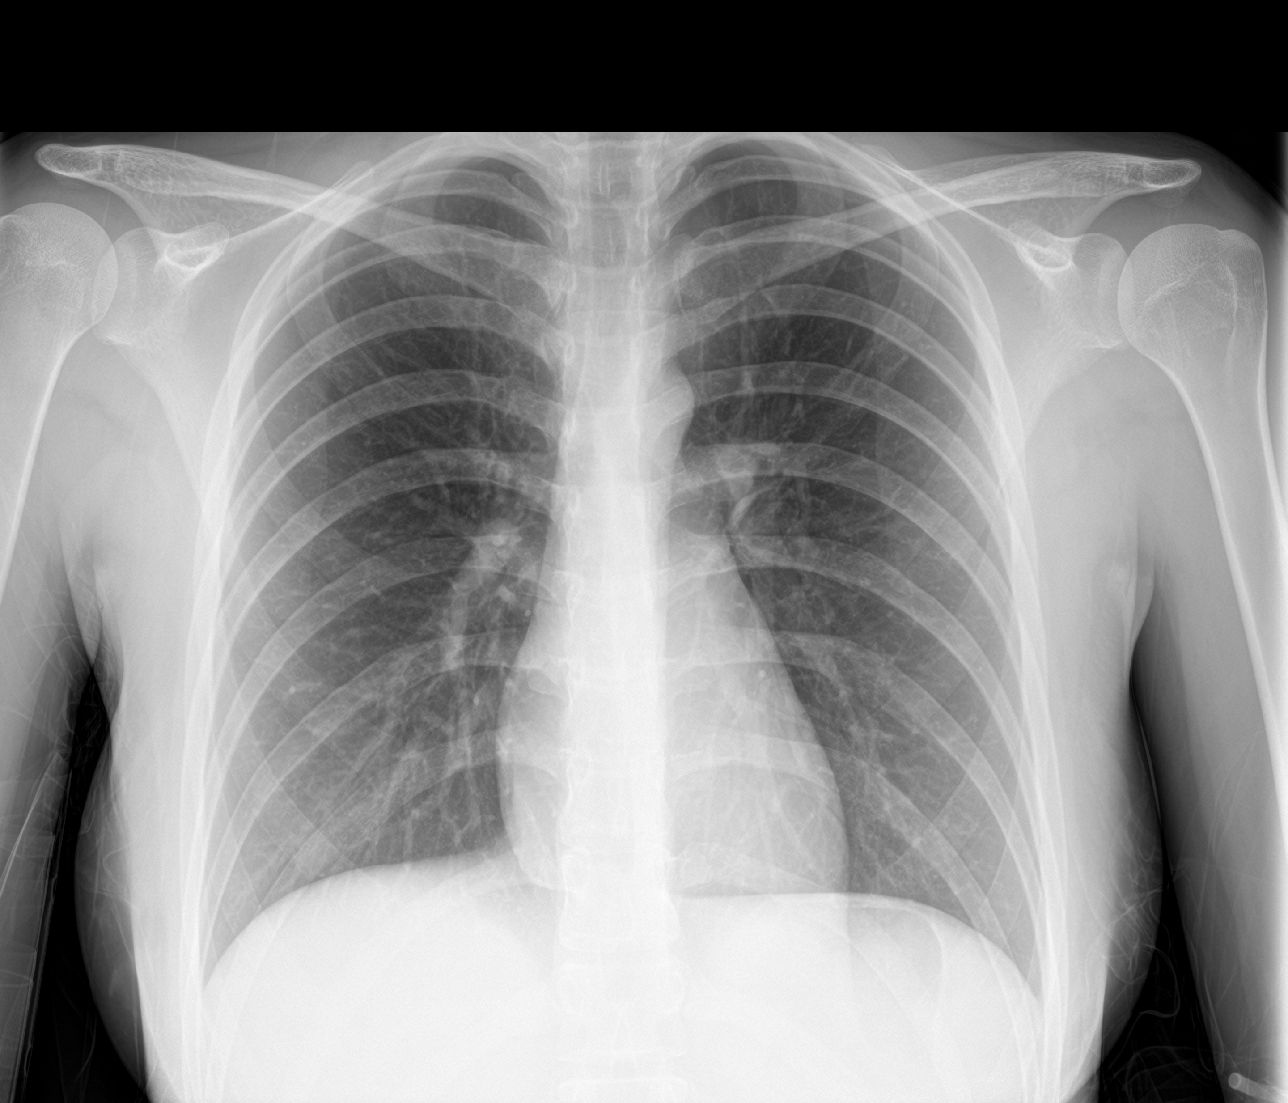

[3 of 3 positions shown; findings below may reference images not displayed]

FINDINGS: There is no evidence of dilated bowel loops or free intraperitoneal
air. No radiopaque calculi or other significant radiographic
abnormality is seen. Heart size and mediastinal contours are within
normal limits. Both lungs are clear.
IMPRESSION: Negative abdominal radiographs.  No acute cardiopulmonary disease.

## 2022-02-11 IMAGING — US US ABDOMEN LIMITED
1 series · 14 of 25 positions shown · non-contrast
Comparison: None.

CLINICAL DATA: Right upper quadrant pain with nausea and vomiting
x3 days.

EXAM:
ULTRASOUND ABDOMEN LIMITED RIGHT UPPER QUADRANT

[Series 1: us abdomen limited ruq (liver/gb) · 14 of 57 slices shown]
[im 1/57]
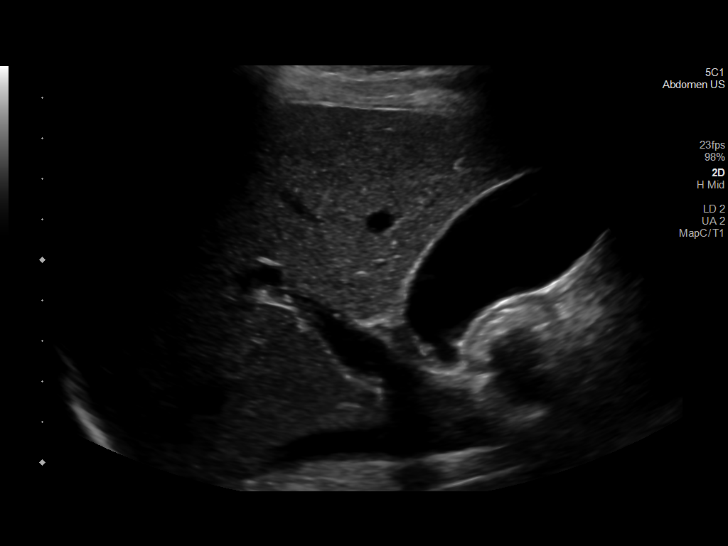
[im 5/57]
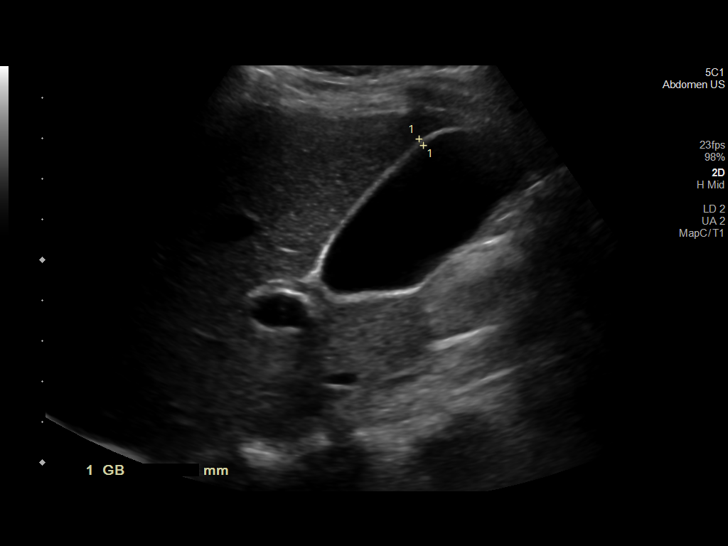
[im 10/57]
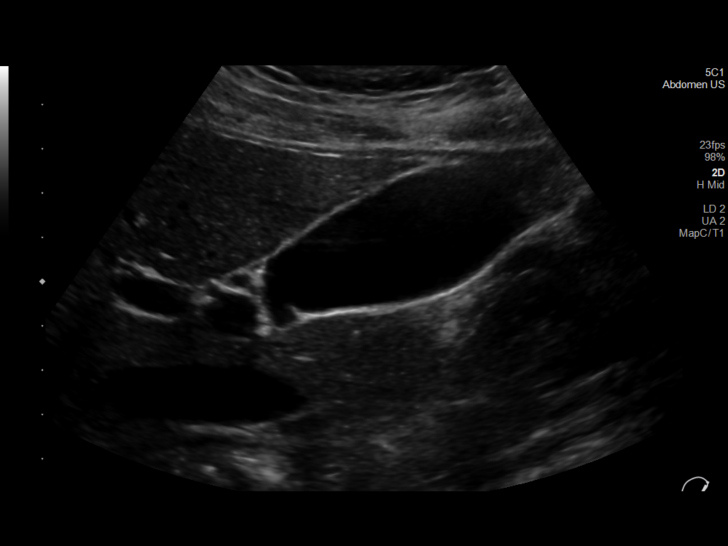
[im 15/57]
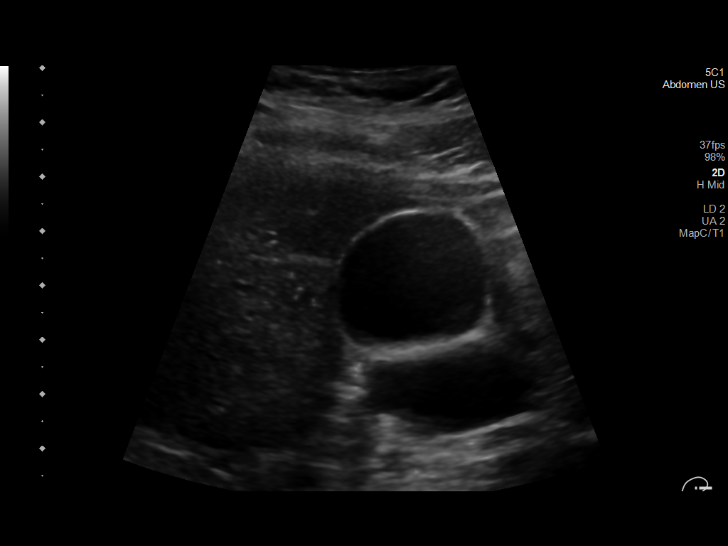
[im 19/57]
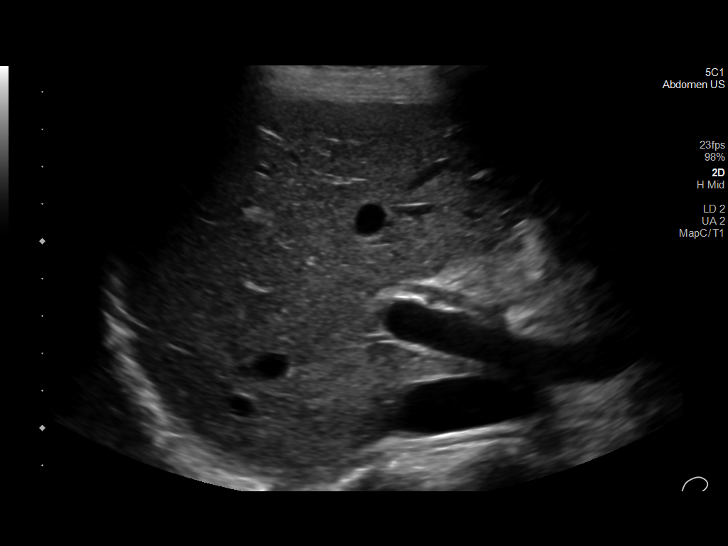
[im 22/57]
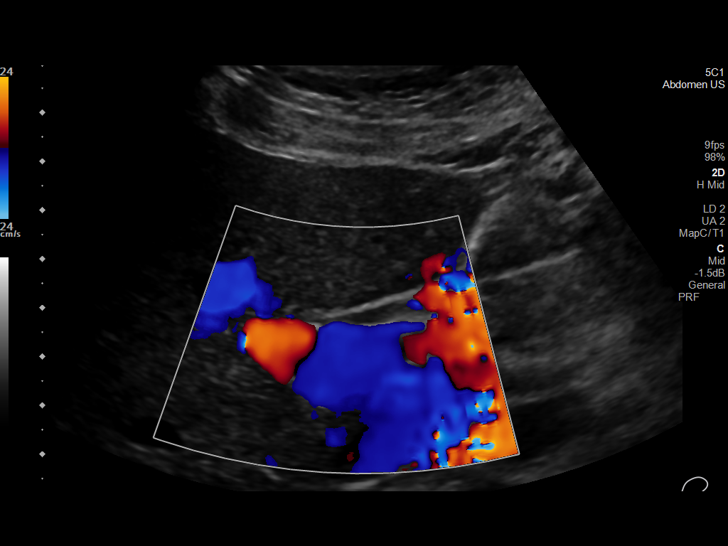
[im 26/57]
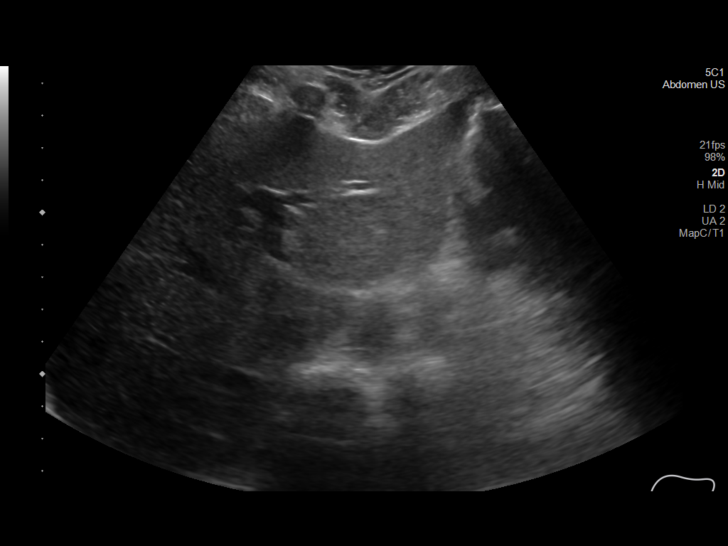
[im 31/57]
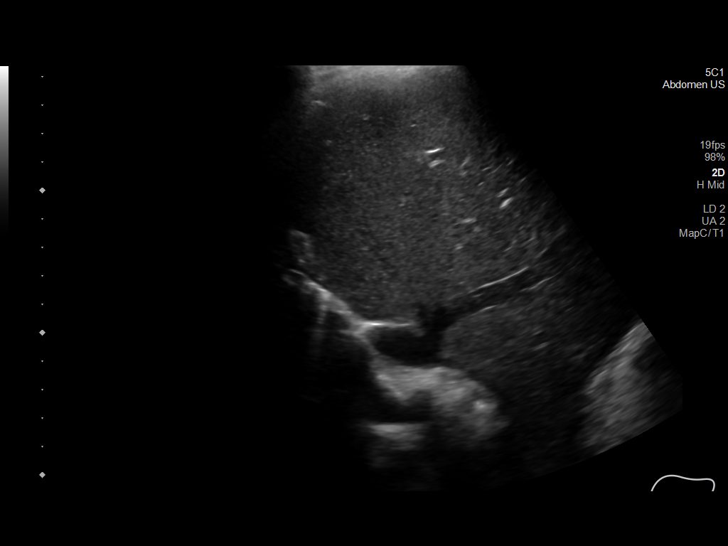
[im 36/57]
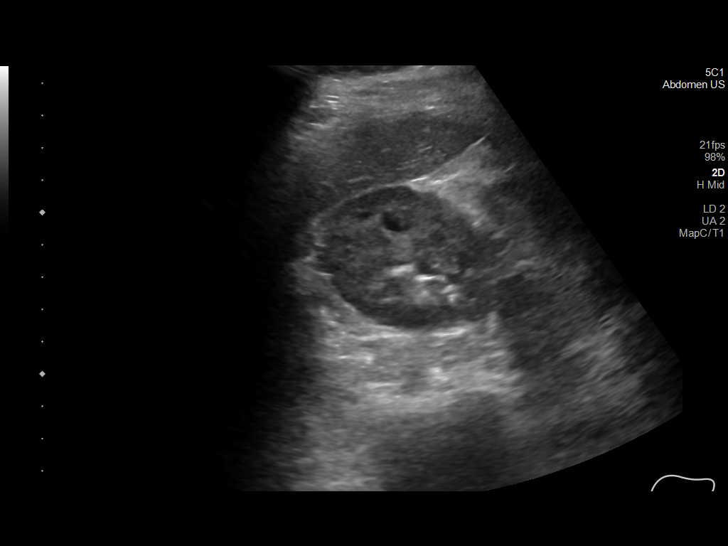
[im 38/57]
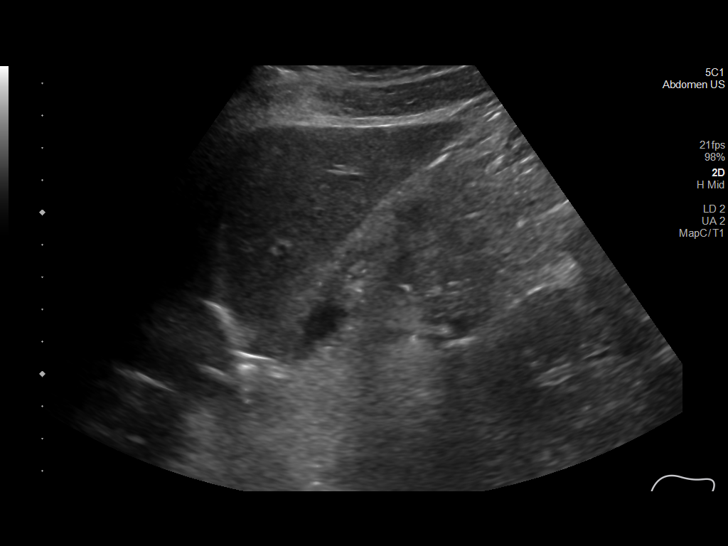
[im 43/57]
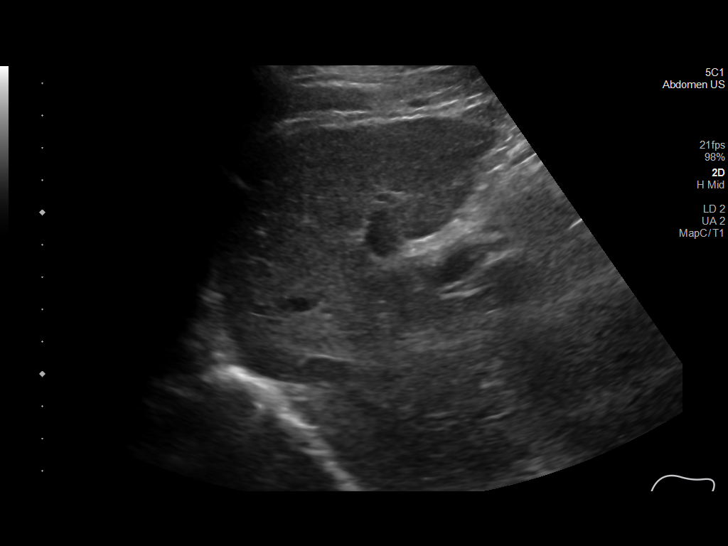
[im 47/57]
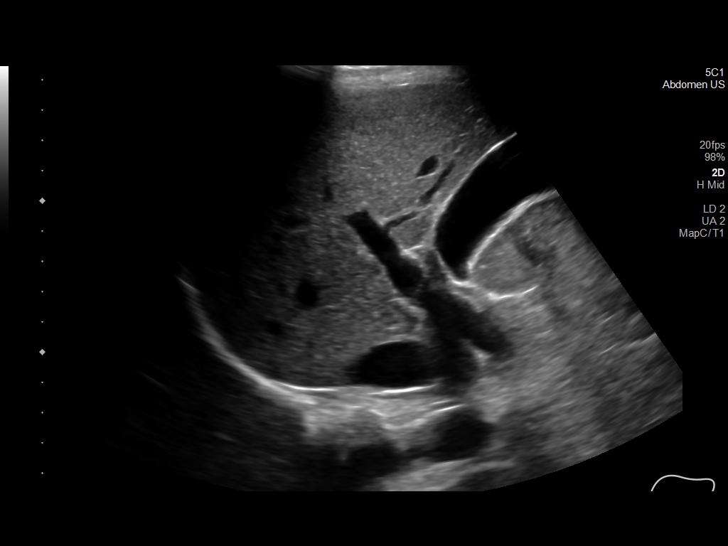
[im 52/57]
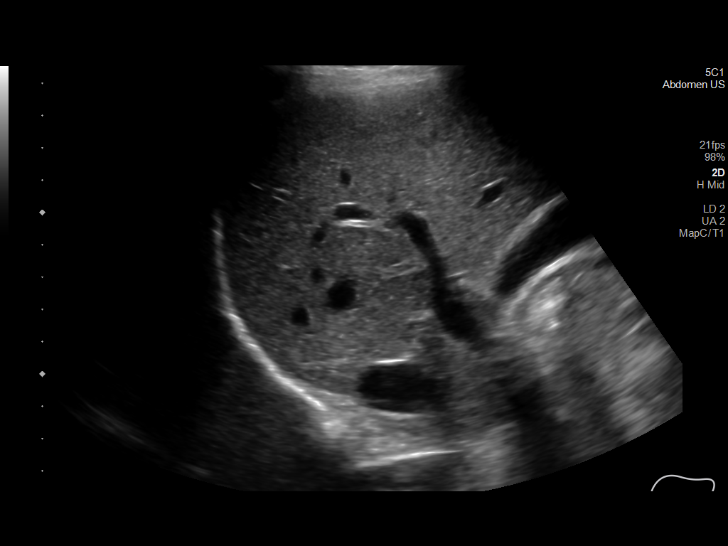
[im 57/57]
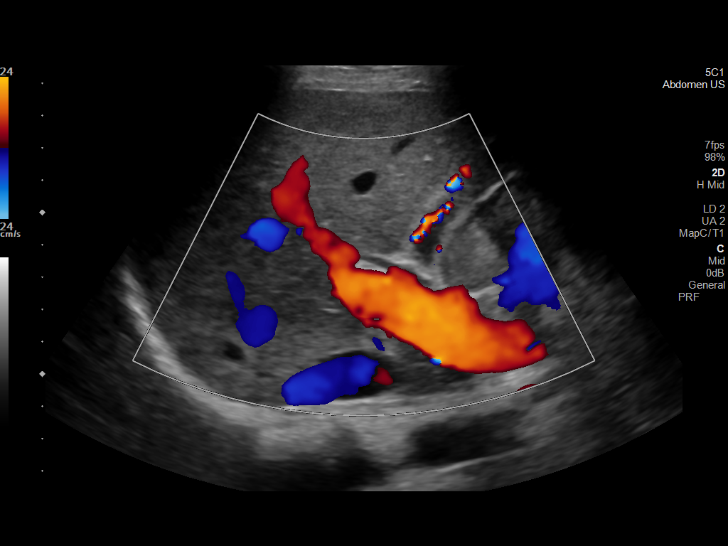

[14 of 25 positions shown; findings below may reference images not displayed]

FINDINGS: Gallbladder:

No gallstones or wall thickening visualized (1.9 mm). No sonographic
Murphy sign noted by sonographer.

Common bile duct:

Diameter: 3.1 mm

Liver:

No focal lesion identified. Within normal limits in parenchymal
echogenicity. Portal vein is patent on color Doppler imaging with
normal direction of blood flow towards the liver.

Other: None.
IMPRESSION: Right upper quadrant ultrasound.

## 2022-03-28 NOTE — Progress Notes (Signed)
Office Visit Note  Patient: Tasha West             Date of Birth: 01/21/2001           MRN: [id number]             PCP: Sue Lush, PA-C Referring: Deliah Goody, MD Visit Date: 04/08/2022 Occupation: @GUAROCC @  Subjective:  Pain in multiple joints and history of rheumatoid arthritis.  History of Present Illness: Tasha West is a 22 y.o. female in consultation per request of Dr. Billey Chang enthesis her previous rheumatologist in [place]) according the patient her symptoms started age 26 and at age 61 she was diagnosed with juvenile arthritis at [facility].  She was started on methotrexate she states she took it until she was 22 years old but had a lot of side effects from methotrexate.  When she turned 17 she started seeing the rheumatologist in [place] and was switched to Enbrel injections.  She has been taking Enbrel 2 mg subcu weekly without any interruption.  She states after she moved to [facility] she stopped getting Enbrel about 4 months ago.  She has been experiencing increased joint pain which she describes in her TMJs, elbows, wrists, hands, hips, knees, ankles and her feet.  She has noticed swelling in her hands, wrist joints and her feet intermittently.  She had been getting eye examinations on a regular basis.  She had a recent eye examination which was normal.  She has had Raynauds symptoms but they are not very bothersome.  She keeps the core temperature warm by warm clothing.  She has not developed any digital ulcers.  No history of oral ulcers, nasal ulcers, malar rash, photosensitivity, or lymphadenopathy.  She is followed by cardiologist for palpitations and a gastroenterologist at [facility] for IBS.  There is family history of psoriatic arthritis in her father and fibromyalgia in her mother.  He is gravida 0.  Contraceptive method is Xulane patch.    Activities of Daily Living:  Patient reports morning stiffness for 1 hour.   Patient Reports nocturnal  pain.  Difficulty dressing/grooming: Denies Difficulty climbing stairs: Denies Difficulty getting out of chair: Denies Difficulty using hands for taps, buttons, cutlery, and/or writing: Denies  Review of Systems  Constitutional:  Positive for fatigue.  HENT:  Negative for mouth sores and mouth dryness.   Eyes:  Negative for dryness.  Respiratory:  Negative for shortness of breath.   Cardiovascular:  Positive for palpitations.       Followed by cardiologist for tachycardia  Gastrointestinal:  Positive for constipation and diarrhea. Negative for blood in stool.  Endocrine: Negative for increased urination.  Genitourinary:  Negative for involuntary urination.  Musculoskeletal:  Positive for joint pain, joint pain, joint swelling, myalgias, morning stiffness, muscle tenderness and myalgias. Negative for gait problem and muscle weakness.  Skin:  Positive for color change. Negative for rash, hair loss and sensitivity to sunlight.  Allergic/Immunologic: Positive for susceptible to infections.  Neurological:  Positive for headaches. Negative for dizziness.  Hematological:  Negative for swollen glands.  Psychiatric/Behavioral:  Positive for sleep disturbance. Negative for depressed mood. The patient is nervous/anxious.     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 04/21/2021    Past Medical History:  Diagnosis Date   Hernia, hiatal    IBS (irritable bowel syndrome)    Iron deficiency    Raynaud's disease    Rheumatoid arthritis ([place])     Family History  Problem Relation Age of Onset   Diabetes Mother     Multiple sclerosis Mother    Fibromyalgia Mother    Rheum arthritis Father    Psoriasis Father    Asthma Brother    Asthma Brother    Allergic rhinitis Brother    Past Surgical History:  Procedure Laterality Date   TYMPANOSTOMY TUBE PLACEMENT     Social History   Social History Narrative   Not on file   Immunization History  Administered Date(s) Administered   Moderna Sars-Covid-2 Vaccination 05/09/2020, 06/27/2020     Objective: Vital Signs: BP 111/74 (BP Location: Right Arm, Patient Position: Sitting, Cuff Size: Normal)   Pulse 94   Resp 14   Ht 5\' 7"  (1.702 m)   Wt 127 lb (57.6 kg)   BMI 19.89 kg/m    Physical Exam Vitals and nursing note reviewed.  Constitutional:      Appearance: She is well-developed.  HENT:     Head: Normocephalic and atraumatic.  Eyes:     Conjunctiva/sclera: Conjunctivae normal.  Cardiovascular:     Rate and Rhythm: Normal rate and regular rhythm.     Heart sounds: Normal heart sounds.  Pulmonary:     Effort: Pulmonary effort is normal.     Breath sounds: Normal breath sounds.  Abdominal:     General: Bowel sounds are normal.     Palpations: Abdomen is soft.  Musculoskeletal:     Cervical back: Normal range of motion.  Lymphadenopathy:     Cervical: No cervical adenopathy.  Skin:    General: Skin is warm and dry.     Capillary Refill: Capillary refill takes less than 2 seconds.     Comments: Acne noted on face.  Neurological:     Mental Status: She is alert and oriented to person, place, and time.  Psychiatric:        Behavior: Behavior normal.      Musculoskeletal Exam: Cervical, thoracic and lumbar spine were in good range of motion.  Shoulder joints, elbow joints, wrist joints, MCPs PIPs and DIPs were in good range of motion with no synovitis.  She has some tenderness on palpation of her TMJs and wrist joints.  Hip joints were in good range of motion.  She had tenderness over SI joints.  Hip joints and knee joints were in  good range of motion.  There was no tenderness over ankles or MTPs.  No synovitis was noted.  CDAI Exam: CDAI Score: 2.8  Patient Global: 4 mm; Provider Global: 4 mm Swollen: 0 ; Tender: 6  Joint Exam 04/08/2022      Right  Left  TMJ   Tender   Tender  Wrist   Tender   Tender  Hip   Tender   Tender     Investigation: No additional findings.  Imaging: No results found.  Recent Labs: Lab Results  Component Value Date   WBC 17.0 (H) 12/12/2021   HGB 10.1 (L) 12/12/2021   PLT 324 12/12/2021   NA 135 12/12/2021   K 4.0 12/12/2021   CL 102 12/12/2021   CO2 20 (L) 12/12/2021  GLUCOSE 128 (H) 12/12/2021   BUN 5 (L) 12/12/2021   CREATININE 0.82 12/12/2021   BILITOT 0.8 12/12/2021   ALKPHOS 39 12/12/2021   AST 16 12/12/2021   ALT 13 12/12/2021   PROT 6.6 12/12/2021   ALBUMIN 3.4 (L) 12/12/2021   CALCIUM 8.6 (L) 12/12/2021   GFRAA >60 01/02/2019    Speciality Comments: No specialty comments available.  Procedures:  No procedures performed Allergies: Patient has no known allergies.   Assessment / Plan:     Visit Diagnoses: Rheumatoid arthritis involving multiple sites with positive rheumatoid factor (HCC) -patient was diagnosed with juvenile rheumatoid arthritis at age 20 and was on methotrexate until age 64.  After moving to [facility] she was under care of her rheumatologist who started her on Enbrel.  Patient states Enbrel worked very well and she has been on Enbrel until 4 months ago when she ran out.  She has been experiencing increased pain and discomfort in multiple joints since she has been off Enbrel but she has not seen much swelling.  She gives history of pain and discomfort in her bilateral TMJs, elbows, wrist, hands, hips, knees, ankles and her feet.  She notices intermittent swelling in her hands and her feet.  No synovitis was noted on the examination today.  Plan: Sedimentation rate, Rheumatoid factor, Cyclic citrul peptide antibody, IgG, ANA.  Patient had a  good response to Enbrel and would like to restart on 9 Ro.  Indications side effects contraindications were discussed at length.  A handout was given and consent was taken.  Will apply for Enbrel.  Third dose of Enbrel will be 50 mg subcu weekly. Medication counseling:   TB Test: Pending Hepatitis panel: Pending SPEP: Pending Immunoglobulins: Pending  Chest x-ray: October 2023 Contraception-Xulane patch  Does patient have diagnosis of heart failure?  No  Counseled patient that Enbrel is a TNF blocking agent.  Reviewed Enbrel dose of 50 mg once weekly.  Counseled patient on purpose, proper use, and adverse effects of Enbrel.  Reviewed the most common adverse effects including infections, headache, and injection site reactions. Discussed that there is the possibility of an increased risk of malignancy but it is not well understood if this increased risk is due to the medication or the disease state.  Advised patient to get yearly dermatology exams due to risk of skin cancer.  Reviewed the importance of regular labs while on Enbrel therapy.  Advised patient to get standing labs one month after starting Enbrel then every 2 months.  Provided patient with standing lab orders.  Counseled patient that Enbrel should be held prior to scheduled surgery.  Counseled patient to avoid live vaccines while on Enbrel.  Advised patient to get annual influenza vaccine and the pneumococcal vaccine as needed.  Provided patient with medication education material and answered all questions.  Patient voiced understanding.  Patient consented to Enbrel.  Will upload consent into the media tab.  Reviewed storage instructions for Enbrel.  Advised initial injection must be administered in office.  Patient voiced understanding.     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 No erosive changes.  High risk medication use -she has been on Enbrel 50 mg sq injections once weekly since 2018.  She ran out of  Enbrel 4 months ago.  He has taken meloixcam 7.5 mg PRN in the past.  She is currently not on meloxicam.  I will obtain baseline labs today.- Plan: CBC with Differential/Platelet, COMPLETE METABOLIC PANEL WITH GFR,  Hepatitis B surface antigen, Hepatitis B core antibody, IgM, Hepatitis C antibody, QuantiFERON-TB Gold Plus, Serum protein electrophoresis with reflex, IgG, IgA, IgM.  Patient was advised to get labs every 3 months to monitor for drug toxicity.  Information regarding immunization was placed in the AVS.  She was also advised to hold Enbrel if she develops an infection and resume after the infection resolves.  Annual skin examination to screen for skin cancer was also advised.  Pain in both hands -she complains of pain and discomfort in her bilateral hands.  No synovitis was noted.  I will obtain baseline x-rays.  Plan: XR Hand 2 View Right, XR Hand 2 View Left.  X-rays of bilateral hands were unremarkable.  Bilateral hip pain -she complains of pain and discomfort in her bilateral hips but points to her SI joints.  Plan: XR HIPS BILAT W OR W/O PELVIS 3-4 VIEWS.  X-rays of bilateral hip joints and SI joints were unremarkable.  Pain in both feet -she complains of discomfort in her bilateral feet and gives history of intermittent swelling.  Plan: XR Foot 2 Views Right, XR Foot 2 Views Left.  X-rays of bilateral feet were unremarkable.  Bilateral temporomandibular joint pain-she complains of TMJ discomfort.  She has popping sensation.  Raynaud's syndrome without gangrene-she has Raynaud's phenomenon.  Keeping core temperature warm and warm clothing was discussed.  Patient denies any history of digital ulcers.  Other fatigue -she gives history of fatigue.  Most like related to insomnia.  Plan: CK  History of IBS-according to the patient she is followed at [facility].  Hiatal hernia  History of anxiety-she is on Prozac.  Other insomnia-good sleep  Vitamin D deficiency -patient gives history of  vitamin D deficiency and fatigue.  Will check vitamin D level today.  Plan: VITAMIN D 25 Hydroxy (Vit-D Deficiency, Fractures)  Vitamin B12 deficiency-she has been taking B12 supplement.  Other iron deficiency anemia  Family history of psoriatic arthritis-father  Family history of fibromyalgia-mother and MGM  Orders: Orders Placed This Encounter  Procedures   XR Hand 2 View Right   XR Hand 2 View Left   XR Foot 2 Views Right   XR Foot 2 Views Left   XR HIPS BILAT W OR W/O PELVIS 3-4 VIEWS   CBC with Differential/Platelet   COMPLETE METABOLIC PANEL WITH GFR   CK   Sedimentation rate   Rheumatoid factor   Cyclic citrul peptide antibody, IgG   ANA   Hepatitis B surface antigen   Hepatitis B core antibody, IgM   Hepatitis C antibody   QuantiFERON-TB Gold Plus   Serum protein electrophoresis with reflex   IgG, IgA, IgM   VITAMIN D 25 Hydroxy (Vit-D Deficiency, Fractures)   No orders of the defined types were placed in this encounter.    Follow-Up Instructions: Return for Rheumatoid arthritis.   Pollyann Savoy, MD  Note - This record has been created using Animal nutritionist.  Chart creation errors have been sought, but may not always  have been located. Such creation errors do not reflect on  the standard of medical care.,

## 2022-04-08 ENCOUNTER — Ambulatory Visit (INDEPENDENT_AMBULATORY_CARE_PROVIDER_SITE_OTHER): Payer: Medicaid Other

## 2022-04-08 ENCOUNTER — Telehealth: Payer: Self-pay | Admitting: Pharmacist

## 2022-04-08 ENCOUNTER — Encounter: Payer: Self-pay | Admitting: Rheumatology

## 2022-04-08 ENCOUNTER — Ambulatory Visit: Payer: Medicaid Other

## 2022-04-08 ENCOUNTER — Ambulatory Visit: Payer: Medicaid Other | Attending: Rheumatology | Admitting: Rheumatology

## 2022-04-08 VITALS — BP 111/74 | HR 94 | Resp 14 | Ht 67.0 in | Wt 127.0 lb

## 2022-04-08 DIAGNOSIS — M79672 Pain in left foot: Secondary | ICD-10-CM | POA: Diagnosis not present

## 2022-04-08 DIAGNOSIS — M0579 Rheumatoid arthritis with rheumatoid factor of multiple sites without organ or systems involvement: Secondary | ICD-10-CM

## 2022-04-08 DIAGNOSIS — M25552 Pain in left hip: Secondary | ICD-10-CM | POA: Diagnosis not present

## 2022-04-08 DIAGNOSIS — I7300 Raynaud's syndrome without gangrene: Secondary | ICD-10-CM

## 2022-04-08 DIAGNOSIS — M0890 Juvenile arthritis, unspecified, unspecified site: Secondary | ICD-10-CM

## 2022-04-08 DIAGNOSIS — M79642 Pain in left hand: Secondary | ICD-10-CM

## 2022-04-08 DIAGNOSIS — E559 Vitamin D deficiency, unspecified: Secondary | ICD-10-CM

## 2022-04-08 DIAGNOSIS — M25551 Pain in right hip: Secondary | ICD-10-CM | POA: Diagnosis not present

## 2022-04-08 DIAGNOSIS — Z8269 Family history of other diseases of the musculoskeletal system and connective tissue: Secondary | ICD-10-CM

## 2022-04-08 DIAGNOSIS — R5383 Other fatigue: Secondary | ICD-10-CM

## 2022-04-08 DIAGNOSIS — D508 Other iron deficiency anemias: Secondary | ICD-10-CM

## 2022-04-08 DIAGNOSIS — M79641 Pain in right hand: Secondary | ICD-10-CM | POA: Diagnosis not present

## 2022-04-08 DIAGNOSIS — M26623 Arthralgia of bilateral temporomandibular joint: Secondary | ICD-10-CM

## 2022-04-08 DIAGNOSIS — Z8659 Personal history of other mental and behavioral disorders: Secondary | ICD-10-CM

## 2022-04-08 DIAGNOSIS — Z8719 Personal history of other diseases of the digestive system: Secondary | ICD-10-CM

## 2022-04-08 DIAGNOSIS — M79671 Pain in right foot: Secondary | ICD-10-CM

## 2022-04-08 DIAGNOSIS — Z79899 Other long term (current) drug therapy: Secondary | ICD-10-CM | POA: Diagnosis not present

## 2022-04-08 DIAGNOSIS — K449 Diaphragmatic hernia without obstruction or gangrene: Secondary | ICD-10-CM

## 2022-04-08 DIAGNOSIS — E538 Deficiency of other specified B group vitamins: Secondary | ICD-10-CM

## 2022-04-08 DIAGNOSIS — Z840 Family history of diseases of the skin and subcutaneous tissue: Secondary | ICD-10-CM

## 2022-04-08 DIAGNOSIS — G4709 Other insomnia: Secondary | ICD-10-CM

## 2022-04-08 NOTE — Patient Instructions (Addendum)
Standing Labs We placed an order today for your standing lab work.   Please have your standing labs drawn in May and every 3 months  Please have your labs drawn 2 weeks prior to your appointment so that the provider can discuss your lab results at your appointment.  Please note that you may see your imaging and lab results in [facility] before we have reviewed them. We will contact you once all results are reviewed. Please allow our office up to 72 hours to thoroughly review all of the results before contacting the office for clarification of your results.  Lab hours are:   Monday through Thursday from 8:00 am -12:30 pm and 1:00 pm-5:00 pm and Friday from 8:00 am-12:00 pm.  Please be advised, all patients with office appointments requiring lab work will take precedent over walk-in lab work.   Labs are drawn by [facility]. Please bring your co-pay at the time of your lab draw.  You may receive a bill from [facility] for your lab work.  Please note if you are on Hydroxychloroquine and and an order has been placed for a Hydroxychloroquine level, you will need to have it drawn 4 hours or more after your last dose.  If you wish to have your labs drawn at another location, please call the office 24 hours in advance so we can fax the orders.  The office is located at [address], [place], [place], [place] [address] No appointment is necessary.    If you have any questions regarding directions or hours of operation,  please call [phone].   As a reminder, please drink plenty of water prior to coming for your lab work. Thanks!   Vaccines You are taking a medication(s) that can suppress your immune system.  The following immunizations are recommended: Flu annually Covid-19  RSV Td/Tdap (tetanus, diphtheria, pertussis) every 10 years Pneumonia (Prevnar 15 then Pneumovax 23 at least 1 year apart.  Alternatively, can take Prevnar 20 without needing additional dose) Shingrix: 2 doses from 4 weeks  to 6 months apart If you have signs or symptoms of an infection or start antibiotics: First, call your PCP for workup of your infection. Hold your medication through the infection, until you complete your antibiotics, and until symptoms resolve if you take the following: Injectable medication (Actemra, Benlysta, Cimzia, Cosentyx, Enbrel, Humira, Kevzara, Orencia, Remicade, Simponi, Stelara, Taltz, Tremfya) Methotrexate Leflunomide (Arava) Mycophenolate (Cellcept) Roma Kayser, or Rinvoq   Please check with your PCP to make sure you are up to date.   Please get an annual skin examination to screen for skin cancer while you are on Enbrel  Etanercept Injection What is this medication? ETANERCEPT (et a [employer]) treats autoimmune conditions, such as psoriasis and certain types of arthritis. It works by slowing down an overactive immune system. It belongs to a group of medications called TNF inhibitors. This medicine may be used for other purposes; ask your health care provider or pharmacist if you have questions. COMMON BRAND NAME(S): Enbrel What should I tell my care team before I take this medication? They need to know if you have any of these conditions: Bleeding disorder Cancer Diabetes Granulomatosis with polyangiitis Heart failure HIV or AIDS Immune system problems Infection, such as tuberculosis (TB) or other bacterial, fungal or viral infections Liver disease Nervous system problems, such as Guillain-Barre syndrome, multiple sclerosis or seizures Recent or upcoming vaccine An unusual or allergic reaction to etanercept, other medications, food, dyes, or preservatives Pregnant or trying to  get pregnant Breastfeeding How should I use this medication? The medication is injected under the skin. You will be taught how to prepare and give it. Take it as directed on the prescription label. Keep taking it unless your care team tells you stop. This medication comes with  INSTRUCTIONS FOR USE. Ask your pharmacist for directions on how to use this medication. Read the information carefully. Talk to your pharmacist or care team if you have questions. If you use a pen, be sure to take off the outer needle cover before using the dose. It is important that you put your used needles and syringes in a special sharps container. Do not put them in a trash can. If you do not have a sharps container, call your pharmacist or care team to get one. A special MedGuide will be given to you by the pharmacist with each prescription and refill. Be sure to read this information carefully each time. Talk to your care team about the use of this medication in children. While it may be prescribed for children as young as 85 years of age for selected conditions, precautions do apply. Overdosage: If you think you have taken too much of this medicine contact a poison control center or emergency room at once. NOTE: This medicine is only for you. Do not share this medicine with others. What if I miss a dose? If you miss a dose, take it as soon as you can. If it is almost time for your next dose, take only that dose. Do not take double or extra doses. What may interact with this medication? Do not take this medication with any of the following: Biologic medications, such as adalimumab, certolizumab, golimumab, infliximab Live vaccines Rilonacept This medication may also interact with the following: Abatacept Anakinra Biologic medications, such as anifrolumab, baricitinib, belimumab, canakinumab, natalizumab, rituximab, sarilumab, tocilizumab, tofacitinib, upadacitinib, vedolizumab Cyclophosphamide Sulfasalazine This list may not describe all possible interactions. Give your health care provider a list of all the medicines, herbs, non-prescription drugs, or dietary supplements you use. Also tell them if you smoke, drink alcohol, or use illegal drugs. Some items may interact with your  medicine. What should I watch for while using this medication? Visit your care team for regular checks on your progress. Tell your care team if your symptoms do not start to get better or if they get worse. This medication may increase your risk of getting an infection. Call your care team for advice if you get a fever, chills, sore throat, or other symptoms of a cold or flu. Do not treat yourself. Try to avoid being around people who are sick. If you have not had the measles or chickenpox vaccines, tell your care team right away if you are around someone with these viruses. You will be tested for tuberculosis (TB) before you start this medication. If your care team prescribes any medication for TB, you should start taking the TB medication before starting this medication. Make sure to finish the full course of TB medication. Avoid taking medications that contain aspirin, acetaminophen, ibuprofen, naproxen, or ketoprofen unless instructed by your care team. These medications may hide fever. Talk to your care team about your risk of cancer. You may be more at risk for certain types of cancer if you take this medication. This medication can decrease the response to a vaccine. If you need to get vaccinated, tell your care team if you have received this medication. Extra booster doses may be needed. Talk to your  care team to see if a different vaccination schedule is needed. What side effects may I notice from receiving this medication? Side effects that you should report to your care team as soon as possible: Allergic reactions--skin rash, itching, hives, swelling of the face, lips, tongue, or throat Body pain, tingling, or numbness Eye pain, change in vision, vision loss Heart failure--shortness of breath, swelling of the ankles, feet, or hands, sudden weight gain, unusual weakness or fatigue Infection--fever, chills, cough, sore throat, wounds that don't heal, pain or trouble when passing urine, general  feeling of discomfort or being unwell Liver injury--right upper belly pain, loss of appetite, nausea, light-colored stool, dark yellow or brown urine, yellowing skin or eyes, unusual weakness or fatigue Low red blood cell level--unusual weakness or fatigue, dizziness, headache, trouble breathing Lupus-like syndrome--joint pain, swelling, or stiffness, butterfly-shaped rash on the face, rashes that get worse in the sun, fever, unusual weakness or fatigue New or worsening psoriasis--rash with itchy, scaly patches Seizures Unusual bruising or bleeding Weakness in arms and legs Side effects that usually do not require medical attention (report to your care team if they continue or are bothersome): Headache Pain, redness, or irritation at injection site Sinus pain or pressure around the face or forehead This list may not describe all possible side effects. Call your doctor for medical advice about side effects. You may report side effects to FDA at 1-800-FDA-1088. Where should I keep my medication? Keep out of the reach of children and pets. See product for storage information. Each product may have different instructions. Get rid of any unused medication after the expiration date. To get rid of medications that are no longer needed or have expired: Take the medication to a medication take-back program. Check with your pharmacy or law enforcement to find a location. If you cannot return the medication, ask your pharmacist or care team how to get rid of this medication safely. NOTE: This sheet is a summary. It may not cover all possible information. If you have questions about this medicine, talk to your doctor, pharmacist, or health care provider.  2023 Elsevier/Gold Standard (2021-08-18 00:00:00)

## 2022-04-08 NOTE — Telephone Encounter (Addendum)
SAVED a Prior Authorization request to  [facility]  for ENBREL via CoverMyMeds. Submission is pending hepatitis B surface antigen (HBsAg) and hepatitis B core antibody and TB gold to result as they will have to be attached  Key: [id number]  Knox Saliva, PharmD, MPH, BCPS, CPP Clinical Pharmacist (Rheumatology and Pulmonology)  ----- Message from [place] sent at 04/08/2022  9:04 AM EST ----- Please apply for enbrel, per Dr. Estanislado Pandy. Patient has previously taken enbrel but has been off of it for 4 months so Dr. Estanislado Pandy does want patient to restart in the office.   Consent obtained and sent to the scan center. Thanks!

## 2022-04-12 LAB — PROTEIN ELECTROPHORESIS, SERUM, WITH REFLEX
Albumin ELP: 4 g/dL (ref 3.8–4.8)
Alpha 1: 0.3 g/dL (ref 0.2–0.3)
Alpha 2: 0.8 g/dL (ref 0.5–0.9)
Beta 2: 0.3 g/dL (ref 0.2–0.5)
Beta Globulin: 0.6 g/dL (ref 0.4–0.6)
Gamma Globulin: 1 g/dL (ref 0.8–1.7)
Total Protein: 7.1 g/dL (ref 6.1–8.1)

## 2022-04-12 LAB — QUANTIFERON-TB GOLD PLUS
Mitogen-NIL: >10 IU/mL
NIL: 0.01 IU/mL
QuantiFERON-TB Gold Plus: NEGATIVE
TB1-NIL: 0 IU/mL
TB2-NIL: 0 IU/mL

## 2022-04-12 LAB — COMPLETE METABOLIC PANEL WITH GFR
AG Ratio: 1.4 (calc) (ref 1.0–2.5)
ALT: 8 U/L (ref 6–29)
AST: 11 U/L (ref 10–30)
Albumin: 4.1 g/dL (ref 3.6–5.1)
Alkaline phosphatase (APISO): 38 U/L (ref 31–125)
BUN: 7 mg/dL (ref 7–25)
CO2: 19 mmol/L — ABNORMAL LOW (ref 20–32)
Calcium: 9 mg/dL (ref 8.6–10.2)
Chloride: 106 mmol/L (ref 98–110)
Creat: 0.63 mg/dL (ref 0.50–0.96)
Globulin: 3 g/dL (calc) (ref 1.9–3.7)
Glucose, Bld: 81 mg/dL (ref 65–99)
Potassium: 4.7 mmol/L (ref 3.5–5.3)
Sodium: 139 mmol/L (ref 135–146)
Total Bilirubin: 0.3 mg/dL (ref 0.2–1.2)
Total Protein: 7.1 g/dL (ref 6.1–8.1)
eGFR: 129 mL/min/{1.73_m2} (ref >=60)

## 2022-04-12 LAB — HEPATITIS B SURFACE ANTIGEN: Hepatitis B Surface Ag: NONREACTIVE

## 2022-04-12 LAB — CBC WITH DIFFERENTIAL/PLATELET
Absolute Monocytes: 529 cells/uL (ref 200–950)
Basophils Absolute: 57 cells/uL (ref 0–200)
Basophils Relative: 0.9 %
Eosinophils Absolute: 38 cells/uL (ref 15–500)
Eosinophils Relative: 0.6 %
HCT: 32 % — ABNORMAL LOW (ref 35.0–45.0)
Hemoglobin: 9.7 g/dL — ABNORMAL LOW (ref 11.7–15.5)
Lymphs Abs: 1814 cells/uL (ref 850–3900)
MCH: 22.4 pg — ABNORMAL LOW (ref 27.0–33.0)
MCHC: 30.3 g/dL — ABNORMAL LOW (ref 32.0–36.0)
MCV: 73.7 fL — ABNORMAL LOW (ref 80.0–100.0)
MPV: 11.9 fL (ref 7.5–12.5)
Monocytes Relative: 8.4 %
Neutro Abs: 3862 cells/uL (ref 1500–7800)
Neutrophils Relative %: 61.3 %
Platelets: 377 10*3/uL (ref 140–400)
RBC: 4.34 10*6/uL (ref 3.80–5.10)
RDW: 16.5 % — ABNORMAL HIGH (ref 11.0–15.0)
Total Lymphocyte: 28.8 %
WBC: 6.3 10*3/uL (ref 3.8–10.8)

## 2022-04-12 LAB — HEPATITIS C ANTIBODY: Hepatitis C Ab: NONREACTIVE

## 2022-04-12 LAB — HEPATITIS B CORE ANTIBODY, IGM: Hep B C IgM: NONREACTIVE

## 2022-04-12 LAB — IGG, IGA, IGM
IgG (Immunoglobin G), Serum: 1083 mg/dL (ref 600–1640)
IgM, Serum: 49 mg/dL — ABNORMAL LOW (ref 50–300)
Immunoglobulin A: 144 mg/dL (ref 47–310)

## 2022-04-12 LAB — SEDIMENTATION RATE: Sed Rate: 9 mm/h (ref 0–20)

## 2022-04-12 LAB — CYCLIC CITRUL PEPTIDE ANTIBODY, IGG: Cyclic Citrullin Peptide Ab: <16 UNITS

## 2022-04-12 LAB — ANA: Anti Nuclear Antibody (ANA): NEGATIVE

## 2022-04-12 LAB — RHEUMATOID FACTOR: Rheumatoid fact SerPl-aCnc: <14 IU/mL (ref <14)

## 2022-04-12 LAB — CK: Total CK: 55 U/L (ref 29–143)

## 2022-04-12 LAB — VITAMIN D 25 HYDROXY (VIT D DEFICIENCY, FRACTURES): Vit D, 25-Hydroxy: 25 ng/mL — ABNORMAL LOW (ref 30–100)

## 2022-04-12 NOTE — Progress Notes (Signed)
Vitamin D is low.  Patient should take vitamin D 2000 units daily.  She is anemic.  She should multivitamin with iron.  We will discuss lab results in detail at the follow-up visit.  Patient will continue Enbrel.  Please forward results to her PCP.

## 2022-04-12 NOTE — Telephone Encounter (Signed)
Prior authorization for Enbrel submitted to [facility] with labs  Key: [id number]  Knox Saliva, PharmD, MPH, BCPS, CPP Clinical Pharmacist (Rheumatology and Pulmonology)

## 2022-04-14 ENCOUNTER — Other Ambulatory Visit (HOSPITAL_COMMUNITY): Payer: Self-pay

## 2022-04-14 NOTE — Telephone Encounter (Signed)
Received notification from  [facility]  regarding a prior authorization for ENBREL. Authorization has been APPROVED from 04/12/2022 to 04/12/2023. Approval letter sent to scan center.  Per test claim, copay for 84 days supply (12 weeks) is $4  Patient can fill through [facility]: [phone]   Authorization # [id number]  Patient can be scheduled Enbrel new start visit  Knox Saliva, PharmD, MPH, BCPS, CPP Clinical Pharmacist (Rheumatology and Pulmonology)
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
## 2022-04-20 ENCOUNTER — Emergency Department (HOSPITAL_COMMUNITY)
Admission: EM | Admit: 2022-04-20 | Discharge: 2022-04-20 | Disposition: A | Discharge status: Home / Self Care | Payer: Medicaid Other | Attending: Emergency Medicine | Admitting: Emergency Medicine

## 2022-04-20 ENCOUNTER — Other Ambulatory Visit: Payer: Self-pay

## 2022-04-20 ENCOUNTER — Encounter (HOSPITAL_COMMUNITY): Payer: Self-pay | Admitting: Emergency Medicine

## 2022-04-20 ENCOUNTER — Emergency Department (HOSPITAL_COMMUNITY): Payer: Medicaid Other

## 2022-04-20 DIAGNOSIS — Y92410 Unspecified street and highway as the place of occurrence of the external cause: Secondary | ICD-10-CM | POA: Diagnosis not present

## 2022-04-20 DIAGNOSIS — R519 Headache, unspecified: Secondary | ICD-10-CM | POA: Insufficient documentation

## 2022-04-20 DIAGNOSIS — M542 Cervicalgia: Secondary | ICD-10-CM | POA: Insufficient documentation

## 2022-04-20 MED ORDER — IBUPROFEN 200 MG PO TABS
600.0000 mg | ORAL_TABLET | Freq: Once | ORAL | Status: AC
  Administered 2022-04-20: 600 mg via ORAL
  Filled 2022-04-20: qty 3

## 2022-04-20 MED ORDER — HYDROCODONE-ACETAMINOPHEN 5-325 MG PO TABS
1.0000 | ORAL_TABLET | Freq: Once | ORAL | Status: AC
  Administered 2022-04-20: 1 via ORAL
  Filled 2022-04-20: qty 1
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
## 2022-04-20 NOTE — Discharge Instructions (Signed)
   Return for any problem.    Apply ice and/or heat as instructed.  Use ibuprofen -600 mg taken every 8 hours -for muscle pain.  You may take Tylenol with the ibuprofen for stronger pain relief.

## 2022-04-20 NOTE — Telephone Encounter (Signed)
ATC patient to schedule Enbrel new start. Unable to reach. Left VM requesting return call. If patient returns call, she can be scheduled for any available appt slot as long as she has no active infection/antibiotic use and no upcoming major surgeries  Knox Saliva, PharmD, MPH, BCPS, CPP Clinical Pharmacist (Rheumatology and Pulmonology)
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
## 2022-04-25 NOTE — Telephone Encounter (Signed)
ATC patient regarding Enbrel new start. Unable to reach. Left VM requesting return call. Per chart review, patient was in [facility] on 04/20/2022  Knox Saliva, PharmD, MPH, BCPS, CPP Clinical Pharmacist (Rheumatology and Pulmonology)

## 2022-04-26 NOTE — Telephone Encounter (Addendum)
Patient is scheduled for Enbrel restart appointment on 05/03/2022  Knox Saliva, PharmD, MPH, BCPS, CPP Clinical Pharmacist (Rheumatology and Pulmonology)

## 2022-04-29 ENCOUNTER — Encounter: Payer: Self-pay | Admitting: Rheumatology

## 2022-04-29 ENCOUNTER — Ambulatory Visit: Payer: Medicaid Other | Attending: Rheumatology | Admitting: Rheumatology

## 2022-04-29 VITALS — BP 120/77 | HR 98 | Resp 16 | Ht 67.0 in | Wt 129.8 lb

## 2022-04-29 DIAGNOSIS — M79671 Pain in right foot: Secondary | ICD-10-CM

## 2022-04-29 DIAGNOSIS — E538 Deficiency of other specified B group vitamins: Secondary | ICD-10-CM

## 2022-04-29 DIAGNOSIS — D508 Other iron deficiency anemias: Secondary | ICD-10-CM

## 2022-04-29 DIAGNOSIS — Z8659 Personal history of other mental and behavioral disorders: Secondary | ICD-10-CM

## 2022-04-29 DIAGNOSIS — M0579 Rheumatoid arthritis with rheumatoid factor of multiple sites without organ or systems involvement: Secondary | ICD-10-CM

## 2022-04-29 DIAGNOSIS — M25551 Pain in right hip: Secondary | ICD-10-CM | POA: Diagnosis not present

## 2022-04-29 DIAGNOSIS — E559 Vitamin D deficiency, unspecified: Secondary | ICD-10-CM

## 2022-04-29 DIAGNOSIS — M79672 Pain in left foot: Secondary | ICD-10-CM

## 2022-04-29 DIAGNOSIS — M79641 Pain in right hand: Secondary | ICD-10-CM | POA: Diagnosis not present

## 2022-04-29 DIAGNOSIS — Z79899 Other long term (current) drug therapy: Secondary | ICD-10-CM

## 2022-04-29 DIAGNOSIS — Z840 Family history of diseases of the skin and subcutaneous tissue: Secondary | ICD-10-CM

## 2022-04-29 DIAGNOSIS — M26623 Arthralgia of bilateral temporomandibular joint: Secondary | ICD-10-CM

## 2022-04-29 DIAGNOSIS — K449 Diaphragmatic hernia without obstruction or gangrene: Secondary | ICD-10-CM

## 2022-04-29 DIAGNOSIS — Z8719 Personal history of other diseases of the digestive system: Secondary | ICD-10-CM

## 2022-04-29 DIAGNOSIS — M79642 Pain in left hand: Secondary | ICD-10-CM

## 2022-04-29 DIAGNOSIS — M0890 Juvenile arthritis, unspecified, unspecified site: Secondary | ICD-10-CM

## 2022-04-29 DIAGNOSIS — Z8269 Family history of other diseases of the musculoskeletal system and connective tissue: Secondary | ICD-10-CM

## 2022-04-29 DIAGNOSIS — G4709 Other insomnia: Secondary | ICD-10-CM

## 2022-04-29 DIAGNOSIS — I7300 Raynaud's syndrome without gangrene: Secondary | ICD-10-CM

## 2022-04-29 DIAGNOSIS — M25552 Pain in left hip: Secondary | ICD-10-CM

## 2022-04-29 NOTE — Patient Instructions (Signed)
Standing Labs We placed an order today for your standing lab work.   Please have your standing labs drawn in May and every 3 months  Please have your labs drawn 2 weeks prior to your appointment so that the provider can discuss your lab results at your appointment, if possible.  Please note that you may see your imaging and lab results in [facility] before we have reviewed them. We will contact you once all results are reviewed. Please allow our office up to 72 hours to thoroughly review all of the results before contacting the office for clarification of your results.  WALK-IN LAB HOURS  Monday through Thursday from 8:00 am -12:30 pm and 1:00 pm-5:00 pm and Friday from 8:00 am-12:00 pm.  Patients with office visits requiring labs will be seen before walk-in labs.  You may encounter longer than normal wait times. Please allow additional time. Wait times may be shorter on  Monday and Thursday afternoons.  We do not book appointments for walk-in labs. We appreciate your patience and understanding with our staff.   Labs are drawn by [facility]. Please bring your co-pay at the time of your lab draw.  You may receive a bill from [facility] for your lab work.  Please note if you are on Hydroxychloroquine and and an order has been placed for a Hydroxychloroquine level,  you will need to have it drawn 4 hours or more after your last dose.  If you wish to have your labs drawn at another location, please call the office 24 hours in advance so we can fax the orders.  The office is located at [address] [place], [place], [place] [address]   If you have any questions regarding directions or hours of operation,  please call [phone].   As a reminder, please drink plenty of water prior to coming for your lab work. Thanks!   Vaccines You are taking a medication(s) that can suppress your immune system.  The following immunizations are recommended: Flu annually Covid-19  RSV Td/Tdap (tetanus,  diphtheria, pertussis) every 10 years Pneumonia (Prevnar 15 then Pneumovax 23 at least 1 year apart.  Alternatively, can take Prevnar 20 without needing additional dose) Shingrix: 2 doses from 4 weeks to 6 months apart  Please check with your PCP to make sure you are up to date.   If you have signs or symptoms of an infection or start antibiotics: First, call your PCP for workup of your infection. Hold your medication through the infection, until you complete your antibiotics, and until symptoms resolve if you take the following: Injectable medication (Actemra, Benlysta, Cimzia, Cosentyx, Enbrel, Humira, Kevzara, Orencia, Remicade, Simponi, Stelara, Taltz, Tremfya) Methotrexate Leflunomide (Arava) Mycophenolate (Cellcept) Morrie Sheldon, Olumiant, or Rinvoq

## 2022-05-03 ENCOUNTER — Other Ambulatory Visit (HOSPITAL_COMMUNITY): Payer: Self-pay

## 2022-05-03 ENCOUNTER — Ambulatory Visit: Payer: Medicaid Other | Attending: Rheumatology | Admitting: Pharmacist

## 2022-05-03 DIAGNOSIS — Z79899 Other long term (current) drug therapy: Secondary | ICD-10-CM

## 2022-05-03 DIAGNOSIS — M0579 Rheumatoid arthritis with rheumatoid factor of multiple sites without organ or systems involvement: Secondary | ICD-10-CM

## 2022-05-03 DIAGNOSIS — Z7189 Other specified counseling: Secondary | ICD-10-CM

## 2022-05-03 MED ORDER — ENBREL MINI 50 MG/ML ~~LOC~~ SOCT
50.0000 mg | SUBCUTANEOUS | 0 refills | Status: DC
  Filled 2022-05-03: qty 12, fill #0
  Filled 2022-05-09: qty 12, 84d supply, fill #0

## 2022-05-03 NOTE — Patient Instructions (Addendum)
Your next ENBREL dose is due on 05/10/22, 05/17/22, and every 7 days thereafter  HOLD ENBREL if you have signs or symptoms of an infection. You can resume once you feel better or back to your baseline. HOLD ENBREL if you start antibiotics to treat an infection. HOLD ENBREL around the time of surgery/procedures. Your surgeon will be able to provide recommendations on when to hold BEFORE and when you are cleared to [facility].  Pharmacy information: Your prescription will be shipped from [facility] Their phone number is [phone] Arvilla Market will call you to schedule the first shipment to your home. His number is [phone] After that, the pharmacy will call to schedule shipment and confirm address. They will mail your medication to your home.  Labs are due in 1 month then every 3 months. Lab hours are from Monday to Thursday 8am-12:30pm and 1pm-5pm and Friday 8am-12pm. You do not need an appointment if you come for labs during these times.  How to manage an injection site reaction: Remember the 5 C's: COUNTER - leave on the counter at least 30 minutes but up to overnight to bring medication to room temperature. This may help prevent stinging COLD - place something cold (like an ice gel pack or cold water bottle) on the injection site just before cleansing with alcohol. This may help reduce pain CLARITIN - use Claritin (generic name is loratadine) for the first two weeks of treatment or the day of, the day before, and the day after injecting. This will help to minimize injection site reactions CORTISONE CREAM - apply if injection site is irritated and itching CALL ME - if injection site reaction is bigger than the size of your fist, looks infected, blisters, or if you develop hives

## 2022-05-03 NOTE — Progress Notes (Signed)
Pharmacy Note  Subjective:   Patient presents to clinic today to restart on Enbrel for rheumatoid arthritis. She has been off of Enbrel since November 2023.  When she first started Enbrel, she had localized red rash without itchiness or irritation - she applied hydrocortisone cream. After some time, the red rash stopped appearing.  Patient running a fever or have signs/symptoms of infection? No  Patient currently on antibiotics for the treatment of infection? No  Patient have any upcoming invasive procedures/surgeries? No  Objective: CMP     Component Value Date/Time   NA 139 04/08/2022 0855   K 4.7 04/08/2022 0855   CL 106 04/08/2022 0855   CO2 19 (L) 04/08/2022 0855   GLUCOSE 81 04/08/2022 0855   BUN 7 04/08/2022 0855   CREATININE 0.63 04/08/2022 0855   CALCIUM 9.0 04/08/2022 0855   PROT 7.1 04/08/2022 0855   PROT 7.1 04/08/2022 0855   ALBUMIN 3.4 (L) 12/12/2021 1014   AST 11 04/08/2022 0855   ALT 8 04/08/2022 0855   ALKPHOS 39 12/12/2021 1014   BILITOT 0.3 04/08/2022 0855   GFRNONAA >60 12/12/2021 1014   GFRAA >60 01/02/2019 2120    CBC    Component Value Date/Time   WBC 6.3 04/08/2022 0855   RBC 4.34 04/08/2022 0855   HGB 9.7 (L) 04/08/2022 0855   HCT 32.0 (L) 04/08/2022 0855   PLT 377 04/08/2022 0855   MCV 73.7 (L) 04/08/2022 0855   MCH 22.4 (L) 04/08/2022 0855   MCHC 30.3 (L) 04/08/2022 0855   RDW 16.5 (H) 04/08/2022 0855   LYMPHSABS 1,814 04/08/2022 0855   MONOABS 1.1 (H) 12/12/2021 1014   EOSABS 38 04/08/2022 0855   BASOSABS 57 04/08/2022 0855    Baseline Immunosuppressant Therapy Labs TB GOLD    Latest Ref Rng & Units 04/08/2022    8:55 AM  Quantiferon TB Gold  Quantiferon TB Gold Plus NEGATIVE NEGATIVE    Hepatitis Panel    Latest Ref Rng & Units 04/08/2022    8:55 AM  Hepatitis  Hep B Surface Ag NON-REACTIVE NON-REACTIVE   Hep B IgM NON-REACTIVE NON-REACTIVE   Hep C Ab NON-REACTIVE NON-REACTIVE    HIV No results found for:  "HIV" Immunoglobulins    Latest Ref Rng & Units 04/08/2022    8:55 AM  Immunoglobulin Electrophoresis  IgA  47 - 310 mg/dL 144   IgG 600 - 1,640 mg/dL 1,083   IgM 50 - 300 mg/dL 49    SPEP    Latest Ref Rng & Units 04/08/2022    8:55 AM  Serum Protein Electrophoresis  Total Protein 6.1 - 8.1 g/dL 6.1 - 8.1 g/dL 7.1    7.1   Albumin 3.8 - 4.8 g/dL 4.0   Alpha-1 0.2 - 0.3 g/dL 0.3   Alpha-2 0.5 - 0.9 g/dL 0.8   Beta Globulin 0.4 - 0.6 g/dL 0.6   Beta 2 0.2 - 0.5 g/dL 0.3   Gamma Globulin 0.8 - 1.7 g/dL 1.0    G6PD No results found for: "G6PDH" TPMT No results found for: "TPMT"   Chest x-ray: 12/12/2021 - No acute cardiopulmonary abnormality.  Assessment/Plan:  Reviewed importance of holding ENBREL with signs/symptoms of an infections, if antibiotics are prescribed to treat an active infection, and with invasive procedures  Demonstrated proper injection technique with Enbrel Mini and injector demo device  Patient able to demonstrate proper injection technique using the teach back method.  Patient self injected in the right upper thigh with:  Sample Medication:  Enbrel Mini '50mg'$ /mL cartridge NDC: [id number] Lot: [id number] Expiration: 01/05/2024  Sample Medication: Enbrel Autotouch Injector Device (patient to use at home moving forward) [place]: [id number]: [id number] Expiration: 03/06/2025  Patient tolerated well.  Observed for 30 mins in office for adverse reaction and none noted. Patient denies itchiness and irritation. No redness or swelling noted.   Patient is to return in 1 month for labs and 6-8 weeks for follow-up appointment.  Standing orders for CBC and CMP placed. Referral to dermatology placed today for yearly skin checks while on TNF inhibitor due to rare risk for non-melanoma skin cancer.  ENBREL approved through insurance .   Rx sent to: [facility]: [phone] .  Patient provided with pharmacy phone number and advised to call  later this week to schedule shipment to home.  She will discard the injector device she has at home that has dead battery  Patient will continue ENBREL '50mg'$  subcut every 7 days as monotherapy  All questions encouraged and answered.  Instructed patient to call with any further questions or concerns.  Knox Saliva, PharmD, MPH, BCPS, CPP Clinical Pharmacist (Rheumatology and Pulmonology)  05/03/2022 7:59 AM

## 2022-05-06 ENCOUNTER — Other Ambulatory Visit (HOSPITAL_COMMUNITY): Payer: Self-pay

## 2022-05-09 ENCOUNTER — Other Ambulatory Visit (HOSPITAL_COMMUNITY): Payer: Self-pay

## 2022-05-09 ENCOUNTER — Other Ambulatory Visit: Payer: Self-pay

## 2022-05-17 ENCOUNTER — Encounter: Payer: Self-pay | Admitting: Rheumatology

## 2022-05-18 ENCOUNTER — Telehealth: Payer: Self-pay | Admitting: Pharmacist

## 2022-05-18 NOTE — Telephone Encounter (Signed)
Received MyChart message from patient that her [employer] coverage is going to end and she may be uninsured. [employer] Safety Net PAP application emailed to her per her request. Advised her to return via email or fax.  Provider portion placed in Dr. Arlean Hopping folder for signature  Knox Saliva, PharmD, MPH, BCPS, CPP Clinical Pharmacist (Rheumatology and Pulmonology)

## 2022-05-19 NOTE — Telephone Encounter (Signed)
Received signed provider portion and patient portion of Enbrel [employer] PAP application. Will be able to submit after 06/05/2022 as this is last day of patient's active insurance per letter she received  Knox Saliva, PharmD, MPH, BCPS, CPP Clinical Pharmacist (Rheumatology and Pulmonology)

## 2022-06-06 ENCOUNTER — Other Ambulatory Visit (HOSPITAL_COMMUNITY): Payer: Self-pay

## 2022-06-06 NOTE — Telephone Encounter (Signed)
Submitted Patient Assistance Application to [employer] for ENBREL along with provider portion, patient portion, med list. No PA or insurance card since patient is uninsured as of 06/06/2022 (nothing populates in eligibility check). Will update patient when we receive a response.  Phone #: [phone] Fax #: [phone]  Knox Saliva, PharmD, MPH, BCPS, CPP Clinical Pharmacist (Rheumatology and Pulmonology)

## 2022-06-14 ENCOUNTER — Other Ambulatory Visit (HOSPITAL_COMMUNITY): Payer: Self-pay

## 2022-06-14 NOTE — Telephone Encounter (Signed)
Called [employer] for status update on Enbrel application. Per rep, there is an active BCBS plan this is populating. I ran check in [place] and also was able to pull a BCBS plan through. [employer] rep states that patient was contacted and VM was left for pt  Phone #: [phone]     Submitted a Prior Authorization request to [facility] for ENBREL via CoverMyMeds. Will update once we receive a response.  Key: [id number]  Chesley Mires, PharmD, MPH, BCPS, CPP Clinical Pharmacist (Rheumatology and Pulmonology)

## 2022-06-16 ENCOUNTER — Other Ambulatory Visit (HOSPITAL_COMMUNITY): Payer: Self-pay

## 2022-06-16 NOTE — Telephone Encounter (Signed)
Received notification from [facility] regarding a prior authorization for ENBREL. Authorization has been APPROVED from 06/14/22 to 06/14/23. Approval letter sent to scan center.  Per test claim, copay for 28 days supply is [phone] (presumably has $7000 deductible). Enbrel savings card only has $7500 loaded for the year.  Patient must fill through [facility]: [phone]   Authorization # [id number] Phone # [phone]    She isn't due for next refill until the end of May 2024.  Enrolled patient into Enbrel copay card to assist with copay. This will be mailed to her home in 2-3 business days. This has been added into [facility] as well. RxBIN: [id number] PCN: [id number] Group: [id number] ID: [id number]  ATC patient to discuss but unable to reach. Left VM. MyChart message sent to patient.  Authorization approval letter faxed to [employer] to request her case be closed out.  Chesley Mires, PharmD, MPH, BCPS, CPP Clinical Pharmacist (Rheumatology and Pulmonology)

## 2022-06-20 ENCOUNTER — Other Ambulatory Visit (HOSPITAL_COMMUNITY): Payer: Self-pay

## 2022-06-20 NOTE — Telephone Encounter (Signed)
Note added in [place] regarding pt's change in insurance, addition of manufacturer copay/debit card, and expectation for next fill to have a high copay. Nothing further should be required at this time.

## 2022-07-14 NOTE — Progress Notes (Deleted)
Office Visit Note  Patient: Tasha West             Date of Birth: [birth year]           MRN: [id number]             PCP: Nathaneil Canary, PA-C Referring: Leonard Downing Visit Date: 07/28/2022 Occupation: @GUAROCC @  Subjective:  No chief complaint on file.   History of Present Illness: Tasha West is a 22 y.o. female ***     Activities of Daily Living:  Patient reports morning stiffness for *** {minute/hour:19697}.   Patient {ACTIONS;DENIES/REPORTS:21021675::"Denies"} nocturnal pain.  Difficulty dressing/grooming: {ACTIONS;DENIES/REPORTS:21021675::"Denies"} Difficulty climbing stairs: {ACTIONS;DENIES/REPORTS:21021675::"Denies"} Difficulty getting out of chair: {ACTIONS;DENIES/REPORTS:21021675::"Denies"} Difficulty using hands for taps, buttons, cutlery, and/or writing: {ACTIONS;DENIES/REPORTS:21021675::"Denies"}  No Rheumatology ROS completed.   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 04/21/2021    Past Medical History:  Diagnosis Date   Hernia, hiatal    IBS (irritable bowel syndrome)    Iron deficiency    Raynaud's disease    Rheumatoid arthritis (HCC)     Family History  Problem Relation Age of Onset   Diabetes Mother    Multiple sclerosis Mother    Fibromyalgia Mother    Rheum arthritis Father    Psoriasis Father    Asthma Brother    Asthma Brother    Allergic rhinitis Brother    Past Surgical History:  Procedure Laterality Date   TYMPANOSTOMY TUBE PLACEMENT     Social History   Social History Narrative   Not on file   Immunization History   Administered Date(s) Administered   Moderna Sars-Covid-2 Vaccination 05/09/2020, 06/27/2020     Objective: Vital Signs: There were no vitals taken for this visit.   Physical Exam   Musculoskeletal Exam: ***  CDAI Exam: CDAI Score: -- Patient Global: --; Provider Global: -- Swollen: --; Tender: -- Joint Exam 07/28/2022   No joint exam has been documented for this visit   There is currently no information documented on the homunculus. Go to the Rheumatology activity and complete the homunculus joint exam.  Investigation: No additional findings.  Imaging: No results found.  Recent Labs: Lab Results  Component Value Date   WBC 6.3 04/08/2022   HGB 9.7 (L) 04/08/2022   PLT 377 04/08/2022   NA 139 04/08/2022   K 4.7 04/08/2022   CL 106 04/08/2022   CO2 19 (L) 04/08/2022   GLUCOSE 81 04/08/2022   BUN 7 04/08/2022   CREATININE 0.63 04/08/2022   BILITOT 0.3 04/08/2022   ALKPHOS 39 12/12/2021   AST 11 04/08/2022   ALT 8 04/08/2022   PROT 7.1 04/08/2022   PROT 7.1 04/08/2022   ALBUMIN 3.4 (L) 12/12/2021   CALCIUM 9.0 04/08/2022   GFRAA >60 01/02/2019   QFTBGOLDPLUS NEGATIVE 04/08/2022    Speciality Comments: MTX until 22 y/o - switched to Enbrel until Nov 2023. Enbrel restarted 05/03/22  Procedures:  No procedures performed Allergies: Patient has no known allergies.   Assessment / Plan:     Visit Diagnoses: Rheumatoid arthritis involving multiple sites with positive rheumatoid factor (HCC)  High risk medication use  Juvenile arthritis (HCC)  Pain in both hands  Bilateral hip pain  Pain in both feet  Bilateral temporomandibular joint pain  Raynaud's syndrome without gangrene  Hiatal hernia  Other insomnia  History of anxiety  Vitamin D deficiency  Other iron deficiency anemia  Vitamin B12 deficiency  Family history of psoriatic arthritis-father  Family history of fibromyalgia-mother and MGM  History of IBS  Orders: No orders of the  defined types were placed in this encounter.  No orders of the defined types were placed in this encounter.   Face-to-face time spent with patient was *** minutes. Greater than 50% of time was spent in counseling and coordination of care.  Follow-Up Instructions: No follow-ups on file.   Gearldine Bienenstock, PA-C  Note - This record has been created using Dragon software.  Chart creation errors have been sought, but may not always  have been located. Such creation errors do not reflect on  the standard of medical care.

## 2022-07-21 ENCOUNTER — Other Ambulatory Visit (HOSPITAL_COMMUNITY): Payer: Self-pay

## 2022-07-25 ENCOUNTER — Other Ambulatory Visit (HOSPITAL_COMMUNITY): Payer: Self-pay

## 2022-07-27 ENCOUNTER — Other Ambulatory Visit (HOSPITAL_COMMUNITY): Payer: Self-pay

## 2022-07-28 ENCOUNTER — Ambulatory Visit: Payer: Medicaid Other | Admitting: Physician Assistant

## 2022-07-28 DIAGNOSIS — Z8659 Personal history of other mental and behavioral disorders: Secondary | ICD-10-CM

## 2022-07-28 DIAGNOSIS — M79671 Pain in right foot: Secondary | ICD-10-CM

## 2022-07-28 DIAGNOSIS — M26623 Arthralgia of bilateral temporomandibular joint: Secondary | ICD-10-CM

## 2022-07-28 DIAGNOSIS — D508 Other iron deficiency anemias: Secondary | ICD-10-CM

## 2022-07-28 DIAGNOSIS — M79642 Pain in left hand: Secondary | ICD-10-CM

## 2022-07-28 DIAGNOSIS — M0890 Juvenile arthritis, unspecified, unspecified site: Secondary | ICD-10-CM

## 2022-07-28 DIAGNOSIS — Z79899 Other long term (current) drug therapy: Secondary | ICD-10-CM

## 2022-07-28 DIAGNOSIS — E559 Vitamin D deficiency, unspecified: Secondary | ICD-10-CM

## 2022-07-28 DIAGNOSIS — E538 Deficiency of other specified B group vitamins: Secondary | ICD-10-CM

## 2022-07-28 DIAGNOSIS — I7300 Raynaud's syndrome without gangrene: Secondary | ICD-10-CM

## 2022-07-28 DIAGNOSIS — M25552 Pain in left hip: Secondary | ICD-10-CM

## 2022-07-28 DIAGNOSIS — K449 Diaphragmatic hernia without obstruction or gangrene: Secondary | ICD-10-CM

## 2022-07-28 DIAGNOSIS — Z8269 Family history of other diseases of the musculoskeletal system and connective tissue: Secondary | ICD-10-CM

## 2022-07-28 DIAGNOSIS — G4709 Other insomnia: Secondary | ICD-10-CM

## 2022-07-28 DIAGNOSIS — Z8719 Personal history of other diseases of the digestive system: Secondary | ICD-10-CM

## 2022-07-28 DIAGNOSIS — M0579 Rheumatoid arthritis with rheumatoid factor of multiple sites without organ or systems involvement: Secondary | ICD-10-CM

## 2022-07-28 DIAGNOSIS — Z840 Family history of diseases of the skin and subcutaneous tissue: Secondary | ICD-10-CM

## 2022-08-12 NOTE — Progress Notes (Deleted)
Office Visit Note  Patient: Tasha West             Date of Birth: 25-Sep-2000           MRN: [id number]             PCP: Nathaneil Canary, PA-C Referring: Leonard Downing Visit Date: 08/16/2022 Occupation: @GUAROCC @  Subjective:  No chief complaint on file.   History of Present Illness: [name] is a 22 y.o. female ***     Activities of Daily Living:  Patient reports morning stiffness for *** {minute/hour:19697}.   Patient {ACTIONS;DENIES/REPORTS:21021675::"Denies"} nocturnal pain.  Difficulty dressing/grooming: {ACTIONS;DENIES/REPORTS:21021675::"Denies"} Difficulty climbing stairs: {ACTIONS;DENIES/REPORTS:21021675::"Denies"} Difficulty getting out of chair: {ACTIONS;DENIES/REPORTS:21021675::"Denies"} Difficulty using hands for taps, buttons, cutlery, and/or writing: {ACTIONS;DENIES/REPORTS:21021675::"Denies"}  No Rheumatology ROS completed.   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 04/21/2021    Past Medical History:  Diagnosis Date   Hernia, hiatal    IBS (irritable bowel syndrome)    Iron deficiency    Raynaud's disease    Rheumatoid arthritis (HCC)     Family History  Problem Relation Age of Onset   Diabetes Mother    Multiple sclerosis Mother    Fibromyalgia Mother    Rheum arthritis Father    Psoriasis Father    Asthma Brother    Asthma Brother    Allergic rhinitis Brother    Past Surgical History:  Procedure Laterality Date   TYMPANOSTOMY TUBE PLACEMENT     Social History   Social History Narrative   Not on file   Immunization History   Administered Date(s) Administered   Moderna Sars-Covid-2 Vaccination 05/09/2020, 06/27/2020     Objective: Vital Signs: There were no vitals taken for this visit.   Physical Exam   Musculoskeletal Exam: ***  CDAI Exam: CDAI Score: -- Patient Global: --; Provider Global: -- Swollen: --; Tender: -- Joint Exam 08/16/2022   No joint exam has been documented for this visit   There is currently no information documented on the homunculus. Go to the Rheumatology activity and complete the homunculus joint exam.  Investigation: No additional findings.  Imaging: No results found.  Recent Labs: Lab Results  Component Value Date   WBC 6.3 04/08/2022   HGB 9.7 (L) 04/08/2022   PLT 377 04/08/2022   NA 139 04/08/2022   K 4.7 04/08/2022   CL 106 04/08/2022   CO2 19 (L) 04/08/2022   GLUCOSE 81 04/08/2022   BUN 7 04/08/2022   CREATININE 0.63 04/08/2022   BILITOT 0.3 04/08/2022   ALKPHOS 39 12/12/2021   AST 11 04/08/2022   ALT 8 04/08/2022   PROT 7.1 04/08/2022   PROT 7.1 04/08/2022   ALBUMIN 3.4 (L) 12/12/2021   CALCIUM 9.0 04/08/2022   GFRAA >60 01/02/2019   QFTBGOLDPLUS NEGATIVE 04/08/2022    Speciality Comments: MTX until 22 y/o - switched to Enbrel until Nov 2023. Enbrel restarted 05/03/22  Procedures:  No procedures performed Allergies: Patient has no known allergies.   Assessment / Plan:     Visit Diagnoses: Rheumatoid arthritis involving multiple sites with positive rheumatoid factor (HCC)  High risk medication use  Juvenile arthritis (HCC)  Pain in both hands  Bilateral hip pain  Pain in both feet  Bilateral temporomandibular joint pain  Raynaud's syndrome without gangrene  Hiatal hernia  History of IBS  Other insomnia  History of anxiety  Vitamin D deficiency  Other iron deficiency anemia  Vitamin B12 deficiency  Family history of psoriatic arthritis-father  Family history of fibromyalgia-mother and MGM  Orders: No orders of the  defined types were placed in this encounter.  No orders of the defined types were placed in this encounter.   Face-to-face time spent with patient was *** minutes. Greater than 50% of time was spent in counseling and coordination of care.  Follow-Up Instructions: No follow-ups on file.   Gearldine Bienenstock, PA-C  Note - This record has been created using Dragon software.  Chart creation errors have been sought, but may not always  have been located. Such creation errors do not reflect on  the standard of medical care.

## 2022-08-16 ENCOUNTER — Ambulatory Visit: Payer: No Typology Code available for payment source | Admitting: Physician Assistant

## 2022-08-16 ENCOUNTER — Telehealth: Payer: Self-pay | Admitting: Rheumatology

## 2022-08-16 DIAGNOSIS — Z8659 Personal history of other mental and behavioral disorders: Secondary | ICD-10-CM

## 2022-08-16 DIAGNOSIS — E559 Vitamin D deficiency, unspecified: Secondary | ICD-10-CM

## 2022-08-16 DIAGNOSIS — Z79899 Other long term (current) drug therapy: Secondary | ICD-10-CM

## 2022-08-16 DIAGNOSIS — Z8719 Personal history of other diseases of the digestive system: Secondary | ICD-10-CM

## 2022-08-16 DIAGNOSIS — G4709 Other insomnia: Secondary | ICD-10-CM

## 2022-08-16 DIAGNOSIS — M26623 Arthralgia of bilateral temporomandibular joint: Secondary | ICD-10-CM

## 2022-08-16 DIAGNOSIS — M79671 Pain in right foot: Secondary | ICD-10-CM

## 2022-08-16 DIAGNOSIS — M25551 Pain in right hip: Secondary | ICD-10-CM

## 2022-08-16 DIAGNOSIS — M0890 Juvenile arthritis, unspecified, unspecified site: Secondary | ICD-10-CM

## 2022-08-16 DIAGNOSIS — D508 Other iron deficiency anemias: Secondary | ICD-10-CM

## 2022-08-16 DIAGNOSIS — M79641 Pain in right hand: Secondary | ICD-10-CM

## 2022-08-16 DIAGNOSIS — E538 Deficiency of other specified B group vitamins: Secondary | ICD-10-CM

## 2022-08-16 DIAGNOSIS — Z8269 Family history of other diseases of the musculoskeletal system and connective tissue: Secondary | ICD-10-CM

## 2022-08-16 DIAGNOSIS — M0579 Rheumatoid arthritis with rheumatoid factor of multiple sites without organ or systems involvement: Secondary | ICD-10-CM

## 2022-08-16 DIAGNOSIS — Z840 Family history of diseases of the skin and subcutaneous tissue: Secondary | ICD-10-CM

## 2022-08-16 DIAGNOSIS — K449 Diaphragmatic hernia without obstruction or gangrene: Secondary | ICD-10-CM

## 2022-08-16 DIAGNOSIS — I7300 Raynaud's syndrome without gangrene: Secondary | ICD-10-CM

## 2022-08-16 NOTE — Telephone Encounter (Signed)
Patient has new Hospital doctor.  Patient requested a referral to a rheumatologist who is in network with new insurance.

## 2022-08-17 ENCOUNTER — Other Ambulatory Visit: Payer: Self-pay | Admitting: *Deleted

## 2022-08-17 DIAGNOSIS — M25551 Pain in right hip: Secondary | ICD-10-CM

## 2022-08-17 DIAGNOSIS — Z79899 Other long term (current) drug therapy: Secondary | ICD-10-CM

## 2022-08-17 DIAGNOSIS — R5383 Other fatigue: Secondary | ICD-10-CM

## 2022-08-17 DIAGNOSIS — M0579 Rheumatoid arthritis with rheumatoid factor of multiple sites without organ or systems involvement: Secondary | ICD-10-CM

## 2022-08-17 DIAGNOSIS — M79671 Pain in right foot: Secondary | ICD-10-CM

## 2022-08-17 DIAGNOSIS — M25552 Pain in left hip: Secondary | ICD-10-CM

## 2022-08-17 DIAGNOSIS — M26623 Arthralgia of bilateral temporomandibular joint: Secondary | ICD-10-CM

## 2022-08-17 DIAGNOSIS — M79641 Pain in right hand: Secondary | ICD-10-CM

## 2022-08-17 DIAGNOSIS — M0890 Juvenile arthritis, unspecified, unspecified site: Secondary | ICD-10-CM

## 2022-08-17 DIAGNOSIS — I7300 Raynaud's syndrome without gangrene: Secondary | ICD-10-CM

## 2022-08-17 NOTE — Telephone Encounter (Signed)
Yes

## 2022-10-10 ENCOUNTER — Other Ambulatory Visit: Payer: Self-pay

## 2022-11-21 ENCOUNTER — Other Ambulatory Visit (HOSPITAL_COMMUNITY): Payer: Self-pay

## 2022-11-21 ENCOUNTER — Other Ambulatory Visit: Payer: Self-pay

## 2022-11-21 MED ORDER — ENBREL SURECLICK 50 MG/ML ~~LOC~~ SOAJ
SUBCUTANEOUS | 3 refills | Status: AC
  Filled 2022-11-30: qty 4, 28d supply, fill #0

## 2022-11-22 ENCOUNTER — Other Ambulatory Visit (HOSPITAL_COMMUNITY): Payer: Self-pay

## 2022-11-22 ENCOUNTER — Other Ambulatory Visit: Payer: Self-pay

## 2022-11-24 ENCOUNTER — Other Ambulatory Visit: Payer: Self-pay

## 2022-11-26 ENCOUNTER — Encounter (HOSPITAL_COMMUNITY): Payer: Self-pay

## 2022-11-28 ENCOUNTER — Other Ambulatory Visit (HOSPITAL_COMMUNITY): Payer: Self-pay

## 2022-11-30 ENCOUNTER — Other Ambulatory Visit: Payer: Self-pay

## 2022-11-30 NOTE — Progress Notes (Signed)
Specialty Pharmacy Initial Fill Coordination Note  Tasha West is a 22 y.o. female contacted today regarding refills of specialty medication(s) Etanercept .  Patient requested [name] at [facility] at [place]  on 12/01/22   Medication will be filled on 9/26.   Patient is aware of $0 copayment. Enbrel copay card

## 2022-12-05 ENCOUNTER — Other Ambulatory Visit: Payer: Self-pay

## 2022-12-09 ENCOUNTER — Other Ambulatory Visit: Payer: Self-pay

## 2022-12-21 ENCOUNTER — Other Ambulatory Visit (HOSPITAL_COMMUNITY): Payer: Self-pay

## 2022-12-23 ENCOUNTER — Other Ambulatory Visit: Payer: Self-pay

## 2022-12-28 ENCOUNTER — Other Ambulatory Visit: Payer: Self-pay

## 2022-12-29 ENCOUNTER — Other Ambulatory Visit: Payer: Self-pay

## 2023-01-13 ENCOUNTER — Other Ambulatory Visit: Payer: Self-pay

## 2023-01-17 ENCOUNTER — Other Ambulatory Visit: Payer: Self-pay

## 2023-01-17 ENCOUNTER — Other Ambulatory Visit (HOSPITAL_COMMUNITY): Payer: Self-pay

## 2023-02-09 ENCOUNTER — Other Ambulatory Visit: Payer: Self-pay

## 2023-02-17 ENCOUNTER — Other Ambulatory Visit: Payer: Self-pay

## 2023-02-21 ENCOUNTER — Other Ambulatory Visit: Payer: Self-pay

## 2023-02-28 ENCOUNTER — Other Ambulatory Visit: Payer: Self-pay

## 2023-03-23 ENCOUNTER — Other Ambulatory Visit: Payer: Self-pay

## 2023-03-23 NOTE — Progress Notes (Signed)
Patient has not filled since 9/24. Disenrolled.

## 2023-05-23 ENCOUNTER — Ambulatory Visit: Payer: Medicaid Other | Admitting: Dermatology
